# Patient Record
Sex: Male | Born: 2000 | Race: White | Hispanic: No | Marital: Single | Smoking: Former smoker
Health system: Southern US, Community
[De-identification: ages and names within clinical notes are randomized; demographics above are authoritative.]

## PROBLEM LIST (undated history)

## (undated) DIAGNOSIS — I10 Essential (primary) hypertension: Secondary | ICD-10-CM

## (undated) DIAGNOSIS — F32A Depression, unspecified: Secondary | ICD-10-CM

## (undated) DIAGNOSIS — E669 Obesity, unspecified: Secondary | ICD-10-CM

## (undated) DIAGNOSIS — M1990 Unspecified osteoarthritis, unspecified site: Secondary | ICD-10-CM

## (undated) DIAGNOSIS — K219 Gastro-esophageal reflux disease without esophagitis: Secondary | ICD-10-CM

## (undated) DIAGNOSIS — F419 Anxiety disorder, unspecified: Secondary | ICD-10-CM

## (undated) DIAGNOSIS — J45909 Unspecified asthma, uncomplicated: Secondary | ICD-10-CM

## (undated) DIAGNOSIS — N400 Enlarged prostate without lower urinary tract symptoms: Secondary | ICD-10-CM

## (undated) DIAGNOSIS — K200 Eosinophilic esophagitis: Secondary | ICD-10-CM

## (undated) DIAGNOSIS — J302 Other seasonal allergic rhinitis: Secondary | ICD-10-CM

## (undated) HISTORY — DX: Obesity, unspecified: E66.9

## (undated) HISTORY — DX: Benign prostatic hyperplasia without lower urinary tract symptoms: N40.0

## (undated) HISTORY — PX: OTHER SURGICAL HISTORY: SHX169

## (undated) HISTORY — DX: Depression, unspecified: F32.A

## (undated) HISTORY — DX: Unspecified asthma, uncomplicated: J45.909

## (undated) HISTORY — DX: Gastro-esophageal reflux disease without esophagitis: K21.9

## (undated) HISTORY — PX: WRIST ARTHROSCOPY: SUR100

## (undated) HISTORY — PX: VENTRAL HERNIA REPAIR: SHX424

## (undated) HISTORY — DX: Essential (primary) hypertension: I10

## (undated) HISTORY — DX: Eosinophilic esophagitis: K20.0

## (undated) HISTORY — PX: TONSILLECTOMY: SUR1361

## (undated) HISTORY — DX: Anxiety disorder, unspecified: F41.9

## (undated) HISTORY — PX: KNEE ARTHROSCOPY: SHX127

## (undated) HISTORY — DX: Other seasonal allergic rhinitis: J30.2

## (undated) HISTORY — PX: HAND SURGERY: SHX662

## (undated) HISTORY — DX: Unspecified osteoarthritis, unspecified site: M19.90

## (undated) HISTORY — PX: CIRCUMCISION: SUR203

## (undated) HISTORY — PX: ANKLE SURGERY: SHX546

---
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
## 2018-02-06 DIAGNOSIS — K439 Ventral hernia without obstruction or gangrene: Secondary | ICD-10-CM | POA: Insufficient documentation

## 2018-02-20 DIAGNOSIS — Z09 Encounter for follow-up examination after completed treatment for conditions other than malignant neoplasm: Secondary | ICD-10-CM | POA: Insufficient documentation

## 2018-06-20 DIAGNOSIS — R1033 Periumbilical pain: Secondary | ICD-10-CM | POA: Insufficient documentation

## 2019-07-10 DIAGNOSIS — S63591A Other specified sprain of right wrist, initial encounter: Secondary | ICD-10-CM | POA: Insufficient documentation

## 2019-07-10 DIAGNOSIS — S52511K Displaced fracture of right radial styloid process, subsequent encounter for closed fracture with nonunion: Secondary | ICD-10-CM | POA: Insufficient documentation

## 2019-09-10 DIAGNOSIS — M545 Low back pain, unspecified: Secondary | ICD-10-CM | POA: Insufficient documentation

## 2019-09-19 DIAGNOSIS — M25531 Pain in right wrist: Secondary | ICD-10-CM | POA: Insufficient documentation

## 2019-11-11 ENCOUNTER — Encounter: Payer: Self-pay | Admitting: Gastroenterology

## 2019-12-11 ENCOUNTER — Ambulatory Visit: Payer: Self-pay | Admitting: Gastroenterology

## 2020-01-01 ENCOUNTER — Encounter: Payer: Self-pay | Admitting: Gastroenterology

## 2020-01-22 ENCOUNTER — Ambulatory Visit: Payer: Self-pay | Admitting: Sports Medicine

## 2020-01-22 ENCOUNTER — Other Ambulatory Visit: Payer: Self-pay | Admitting: Sports Medicine

## 2020-01-22 DIAGNOSIS — M25571 Pain in right ankle and joints of right foot: Secondary | ICD-10-CM

## 2020-01-29 ENCOUNTER — Ambulatory Visit: Payer: Medicaid Other | Admitting: Gastroenterology

## 2020-02-27 ENCOUNTER — Ambulatory Visit: Payer: Medicaid Other | Admitting: Gastroenterology

## 2020-02-27 ENCOUNTER — Encounter: Payer: Self-pay | Admitting: Gastroenterology

## 2020-02-27 VITALS — BP 124/80 | HR 84 | Ht 75.0 in | Wt 231.0 lb

## 2020-02-27 DIAGNOSIS — R634 Abnormal weight loss: Secondary | ICD-10-CM | POA: Diagnosis not present

## 2020-02-27 DIAGNOSIS — K219 Gastro-esophageal reflux disease without esophagitis: Secondary | ICD-10-CM | POA: Diagnosis not present

## 2020-02-27 DIAGNOSIS — R109 Unspecified abdominal pain: Secondary | ICD-10-CM | POA: Diagnosis not present

## 2020-02-27 MED ORDER — DICYCLOMINE HCL 20 MG PO TABS: 20.0000 mg | ORAL_TABLET | Freq: Three times a day (TID) | ORAL | 1 refills | Status: DC

## 2020-02-27 MED ORDER — OMEPRAZOLE 40 MG PO CPDR: 40.0000 mg | DELAYED_RELEASE_CAPSULE | Freq: Two times a day (BID) | ORAL | 3 refills | Status: DC

## 2020-02-27 MED ORDER — SUTAB 1479-225-188 MG PO TABS: ORAL_TABLET | ORAL | 0 refills | Status: DC

## 2020-02-27 NOTE — Progress Notes (Signed)
Referring Provider: Ventura Sellers, MD Primary Care Physician:  Ventura Sellers, MD  Reason for Consultation:  Abdominal pain   IMPRESSION:  Acute episodes of abdominal pain with associated change in bowel habits.diarrhea Episode of blood on the toilet paper Reflux exacerbated despite medical therapy Unintentional weight loss of 50 pounds  Abdominal pain: Possible PUD. Differential is broad with associated change in bowel habits, weight loss, and blood on the toilet paper. EGD and colonoscopy recommended. Will obtain a copy of his CT scan from [facility].    PLAN: - Continue omeprazole 40 mg BID and famotidine 20 mg daily - Trial of dicyclomine 20 mg QID prn abdominal pain - EGD with esophageal, gastric, and duodenal biopsies - Colonoscopy with evaluation of the TI including biopsies - Obtain a copy of the CT scan from [facility]  Please see the "Patient Instructions" section for addition details about the plan.  HPI: Bob Roy is a 19 y.o. male referred by Dr. Delena Bali for further evaluation of abdominal pain and gastritis. The history is obtained through the patient and review of his referral records.  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 Majoring in missions with the goal of going overseas. Hoping to go to [place].  He completed the Covid vaccine.   History of obesity, lifetime history of GERD, asthma, hypertenion, depression, umbilical hernia repair, and asthma with worsening symptoms after a difficult relationship break-up in March. Unintentionally lost 50 pounds and found his symptoms worsened during that time  Now with episodes of acid reflux, diarrhea, and severe abdominal pain. Having 2-3 episodes every week Day starts with "acidic eructations" followed by vomiting "my guts up" and diarrhea.  Moving to only water without eating will improve his symptoms Can last for 3-4 days at a time Abdominal pain is an  epigastric, burning pain Ibuprofen 1-2 times daily controls his pain Rare blood on the toilet paper. No mucous in the stool.   He reports that a CT scan obtained during the evaluation for chest pain showed peptic ulcer ([place]) Lipase and liver enzymes were normal Referral records include abdominal x-ray 11/10/19 that was essentially normal Omeprazole increased to 40 mg daily and added famotidine.  No significant change in his symptoms despite medication changes.  Similar symptoms evaluated in 2008 included a normal abdominal ultrasound. UGI series showed marked GE reflux.   Mother with breast cancer with liver mets. Maternal grandmother with IBD. No known family history of colon cancer or polyps. No family history of uterine/endometrial cancer, pancreatic cancer or gastric/stomach cancer.   Past Medical History:  Diagnosis Date  . Asthma   . GERD (gastroesophageal reflux disease)   . HTN (hypertension)   . Obesity   . Seasonal allergies     Past Surgical History:  Procedure Laterality Date  . CIRCUMCISION    . HAND SURGERY     Ulna   . hernia    . KNEE ARTHROSCOPY    . WRIST ARTHROSCOPY      Current Outpatient Medications  Medication Sig Dispense Refill  . albuterol (VENTOLIN HFA) 108 (90 BASE) MCG/ACT inhaler Inhale 2 puffs into the lungs every 4 (four) hours as needed for wheezing or shortness of breath.    . cetirizine (ZYRTEC) 10 MG tablet TAKE 1 TABLET EVERY DAY AS NEEDED FOR RUNNY NOSE OR ITCHING  5  . cyproheptadine (PERIACTIN) 4 MG tablet Take 4 mg by mouth daily.    . fluticasone (FLONASE) 50  MCG/ACT nasal spray Place 2 sprays into both nostrils 2 (two) times daily.   3  . ibuprofen (ADVIL,MOTRIN) 400 MG tablet TAKE 1 TABLET EVERY 6 HOURS WITH FOOD  1  . Magnesium Oxide 500 MG TABS Take by mouth.    . metFORMIN (GLUCOPHAGE-XR) 500 MG 24 hr tablet Take 500 mg by mouth daily.  2  . metoprolol succinate (TOPROL-XL) 100 MG 24 hr tablet Take 100 mg by mouth daily.  2   . montelukast (SINGULAIR) 5 MG chewable tablet CHEW AND SWALLOW 1 TABLET DAILY  2  . omeprazole (PRILOSEC) 20 MG capsule TAKE 1 CAPSULE ONCE A DAY FOR REFLUX  5  . QVAR 40 MCG/ACT inhaler INHALE 2 PUFFS BY MOUTH EVERY DAY TO PREVENT COUGH/WHEEZE (RINSE MOUTH AFTER USE)  5  . riboflavin (VITAMIN B-2) 100 MG TABS tablet Take 100 mg by mouth daily.    Marland Kitchen topiramate (TOPAMAX) 25 MG tablet Take 2 tablets (50 mg total) by mouth 2 (two) times daily. (Start with 25 mg twice a day for the first week) 120 tablet 3   No current facility-administered medications for this visit.    Allergies as of 02/27/2020 - Review Complete 01/14/2015  Allergen Reaction Noted  . Amoxicillin Rash 05/09/2013  . Augmentin [amoxicillin-pot clavulanate] Rash 01/14/2015  . Sulfa antibiotics Rash 01/14/2015  . Sulfur Rash 05/09/2013    Family History  Problem Relation Age of Onset  . Migraines Mother        Resolved in adulthood  . Depression Mother   . Anxiety disorder Mother   . Cancer Father   . Cancer Paternal Grandmother   . Epilepsy Cousin     Social History   Socioeconomic History  . Marital status: Single    Spouse name: Not on file  . Number of children: Not on file  . Years of education: Not on file  . Highest education level: Not on file  Occupational History  . Not on file  Tobacco Use  . Smoking status: Never Smoker  . Smokeless tobacco: Never Used  Substance and Sexual Activity  . Alcohol use: No  . Drug use: No  . Sexual activity: Never    Birth control/protection: Abstinence  Other Topics Concern  . Not on file  Social History Narrative  . Not on file   Social Determinants of Health   Financial Resource Strain:   . Difficulty of Paying Living Expenses: Not on file  Food Insecurity:   . Worried About Charity fundraiser in the Last Year: Not on file  . Ran Out of Food in the Last Year: Not on file  Transportation Needs:   . Lack of Transportation (Medical): Not on file  .  Lack of Transportation (Non-Medical): Not on file  Physical Activity:   . Days of Exercise per Week: Not on file  . Minutes of Exercise per Session: Not on file  Stress:   . Feeling of Stress : Not on file  Social Connections:   . Frequency of Communication with Friends and Family: Not on file  . Frequency of Social Gatherings with Friends and Family: Not on file  . Attends Religious Services: Not on file  . Active Member of Clubs or Organizations: Not on file  . Attends Archivist Meetings: Not on file  . Marital Status: Not on file  Intimate Partner Violence:   . Fear of Current or Ex-Partner: Not on file  . Emotionally Abused: Not on file  .  Physically Abused: Not on file  . Sexually Abused: Not on file    Review of Systems: 12 system ROS is negative except as noted above with anxiety, arthritis, back pain, confusion, depression, and fatigue.   Physical Exam: General:   Alert,  well-nourished, pleasant and cooperative in NAD Head:  Normocephalic and atraumatic. Eyes:  Sclera clear, no icterus.   Conjunctiva pink. Ears:  Normal auditory acuity. Nose:  No deformity, discharge,  or lesions. Mouth:  No deformity or lesions.   Neck:  Supple; no masses or thyromegaly. Lungs:  Clear throughout to auscultation.   No wheezes. Heart:  Regular rate and rhythm; no murmurs. Abdomen:  Soft,nontender, nondistended, normal bowel sounds, no rebound or guarding. No hepatosplenomegaly.   Rectal:  Deferred  Msk:  Symmetrical. No boney deformities LAD: No inguinal or umbilical LAD Extremities:  No clubbing or edema. Neurologic:  Alert and  oriented x4;  grossly nonfocal Skin:  Intact without significant lesions or rashes. Psych:  Alert and cooperative. Normal mood and affect.     Salomon Ganser L. Tarri Glenn, MD, MPH 02/27/2020, 2:15 PM

## 2020-02-27 NOTE — Patient Instructions (Addendum)
If you are age 21 or older, your body mass index should be between 23-30. Your Body mass index is 28.87 kg/m. If this is out of the aforementioned range listed, please consider follow up with your Primary Care Provider.  If you are age 74 or younger, your body mass index should be between 19-25. Your Body mass index is 28.87 kg/m. If this is out of the aformentioned range listed, please consider follow up with your Primary Care Provider.   You have been scheduled for an endoscopy and colonoscopy. Please follow the written instructions given to you at your visit today. Please pick up your prep supplies at the pharmacy within the next 1-3 days. If you use inhalers (even only as needed), please bring them with you on the day of your procedure.  Continue omeprazole 40 mg twice a day and continue famotidine 20 mg once a day. Start Dicyclomine 20 mg 4 times a day with meals and bedtime as needed for abdominal pain.  Thank you for trusting me with your gastrointestinal care!    Thornton Park, MD, MPH

## 2020-03-01 ENCOUNTER — Other Ambulatory Visit: Payer: Self-pay

## 2020-03-01 DIAGNOSIS — K219 Gastro-esophageal reflux disease without esophagitis: Secondary | ICD-10-CM

## 2020-03-01 DIAGNOSIS — R109 Unspecified abdominal pain: Secondary | ICD-10-CM

## 2020-03-01 DIAGNOSIS — R634 Abnormal weight loss: Secondary | ICD-10-CM

## 2020-03-10 ENCOUNTER — Other Ambulatory Visit: Payer: Self-pay | Admitting: Sports Medicine

## 2020-03-10 ENCOUNTER — Ambulatory Visit: Payer: Medicaid Other | Admitting: Sports Medicine

## 2020-03-10 ENCOUNTER — Ambulatory Visit (INDEPENDENT_AMBULATORY_CARE_PROVIDER_SITE_OTHER): Payer: Medicaid Other

## 2020-03-10 ENCOUNTER — Other Ambulatory Visit: Payer: Self-pay

## 2020-03-10 ENCOUNTER — Encounter: Payer: Self-pay | Admitting: Sports Medicine

## 2020-03-10 DIAGNOSIS — M779 Enthesopathy, unspecified: Secondary | ICD-10-CM

## 2020-03-10 DIAGNOSIS — G8929 Other chronic pain: Secondary | ICD-10-CM | POA: Diagnosis not present

## 2020-03-10 DIAGNOSIS — M7751 Other enthesopathy of right foot: Secondary | ICD-10-CM | POA: Diagnosis not present

## 2020-03-10 DIAGNOSIS — M25371 Other instability, right ankle: Secondary | ICD-10-CM

## 2020-03-10 DIAGNOSIS — M25571 Pain in right ankle and joints of right foot: Secondary | ICD-10-CM

## 2020-03-10 DIAGNOSIS — M79671 Pain in right foot: Secondary | ICD-10-CM

## 2020-03-10 MED ORDER — PREDNISONE 10 MG (21) PO TBPK
ORAL_TABLET | ORAL | 0 refills | Status: DC
Start: 2020-03-10 — End: 2020-04-08

## 2020-03-10 MED ORDER — MELOXICAM 15 MG PO TABS: 15.0000 mg | ORAL_TABLET | Freq: Every day | ORAL | 0 refills | Status: DC

## 2020-03-10 NOTE — Progress Notes (Signed)
Subjective:  Bob Roy is a 19 y.o. male patient who presents to office for evaluation of right ankle pain. Patient complains of continued pain in the ankle for many years slowly getting worse with some clicking when walking and standing reports that when he was a kid in the fourth grade had injuries to the ankle and fractured his growth plate. Patient has tried icing heat and topical compression/wrap with no relief in symptoms. Patient denies any other pedal complaints. Denies recent injury/trip/fall/sprain/any other causative factors.  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 07/10/2019   Traumatic tear of triangular fibrocartilage complex of right wrist 16/04/9603   Umbilical pain 54/03/8118   Postoperative examination 02/20/2018   Ventral hernia without obstruction or gangrene 02/06/2018   Myofascial pain 04/25/2016   Asthma 03/23/2015   AR (allergic rhinitis) 03/23/2015   LPRD (laryngopharyngeal reflux disease) 03/23/2015   Other secondary hypertension 01/14/2015   Postconcussion syndrome 01/14/2015   Migraine without aura and without status migrainosus, not intractable 01/14/2015   Chronic daily headache 01/14/2015   Medication overuse headache 01/14/2015   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     dicyclomine (BENTYL) 20 MG tablet Take 1 tablet (20 mg total) by mouth 4 (four) times daily -  before meals and at bedtime. 120  tablet 1   famotidine (PEPCID) 40 MG tablet Take 40 mg by mouth at bedtime.     ibuprofen (ADVIL,MOTRIN) 400 MG tablet TAKE 1 TABLET EVERY 6 HOURS WITH FOOD  1   omeprazole (PRILOSEC) 40 MG capsule Take 40 mg by mouth every morning.     Sodium Sulfate-Mag Sulfate-KCl (SUTAB) [phone] MG TABS Use kit as directed MANUFACTURER CODES!! BIN: [id number] PCN: CN GROUP: [id number] MEMBER ID: [id number];VOZ AS CASH;NO PRIOR AUTHORIZATION 12 tablet 0   No current facility-administered medications on file prior to visit.    Allergies  Allergen Reactions   Amoxicillin Rash   Augmentin [Amoxicillin-Pot Clavulanate] Rash   Sulfa Antibiotics Rash   Sulfur Rash    Objective:  General: Alert and oriented x3 in no acute distress  Dermatology: No open lesions bilateral lower extremities, no webspace macerations, no ecchymosis bilateral, all nails x 10 are well manicured.  Vascular: Dorsalis Pedis and Posterior Tibial pedal pulses palpable, Capillary Fill Time 3 seconds,(+) pedal hair growth bilateral, no edema bilateral lower extremities, Temperature gradient within normal limits.  Neurology: Johney Maine sensation intact via light touch bilateral.  Musculoskeletal: Mild tenderness with palpation at lateral ankle along the peroneal tendon there is also pain of noted to the medial ankle at the medial ankle gutter on the right, negative talar tilt, Negative tib-fib stress, subjective instability and clicking right ankle but no audible clicking on range of motion at today's exam. No pain with calf compression bilateral. Range of motion within normal limits with mild guarding on right ankle. Strength within normal limits in all groups bilateral.   Gait: Antalgic gait  Xrays  Right ankle  Impression: No acute osseous finding  Assessment and Plan: Problem List Items Addressed This Visit    None    Visit Diagnoses    Chronic pain of right ankle    -  Primary   Relevant Medications   meloxicam  (MOBIC) 15 MG tablet   predniSONE (STERAPRED UNI-PAK 21 TAB) 10 MG (21) TBPK tablet   Ankle instability, right       Tendinitis           -Complete examination performed -Xrays reviewed -Discussed treatement options for history of chronic pain and ankle with subjective instability and possible superimposed tendinitis -Advised patient to continue with ankle support that he already has -Prescribed meloxicam for patient to take as instructed as well as steroid Dosepak -Dispense Surgigrip pression sleeve for patient to wear for edema control additional support to ankle -Advised patient if symptoms continue may benefit from an MRI since have been going on for years -Patient to return to office as scheduled or sooner if condition worsens.  Landis Martins, DPM

## 2020-03-19 ENCOUNTER — Other Ambulatory Visit: Payer: Self-pay | Admitting: Sports Medicine

## 2020-03-19 DIAGNOSIS — M779 Enthesopathy, unspecified: Secondary | ICD-10-CM

## 2020-03-22 ENCOUNTER — Other Ambulatory Visit: Payer: Self-pay | Admitting: Gastroenterology

## 2020-04-05 ENCOUNTER — Other Ambulatory Visit: Payer: Self-pay | Admitting: Sports Medicine

## 2020-04-06 ENCOUNTER — Other Ambulatory Visit: Payer: Self-pay | Admitting: Gastroenterology

## 2020-04-07 ENCOUNTER — Other Ambulatory Visit: Payer: Self-pay

## 2020-04-07 ENCOUNTER — Ambulatory Visit: Payer: Medicaid Other | Admitting: Sports Medicine

## 2020-04-07 ENCOUNTER — Ambulatory Visit (INDEPENDENT_AMBULATORY_CARE_PROVIDER_SITE_OTHER): Payer: Medicaid Other | Admitting: Sports Medicine

## 2020-04-07 ENCOUNTER — Telehealth: Payer: Self-pay

## 2020-04-07 ENCOUNTER — Encounter: Payer: Self-pay | Admitting: Sports Medicine

## 2020-04-07 DIAGNOSIS — M7751 Other enthesopathy of right foot: Secondary | ICD-10-CM

## 2020-04-07 DIAGNOSIS — M779 Enthesopathy, unspecified: Secondary | ICD-10-CM

## 2020-04-07 DIAGNOSIS — M25571 Pain in right ankle and joints of right foot: Secondary | ICD-10-CM

## 2020-04-07 DIAGNOSIS — G8929 Other chronic pain: Secondary | ICD-10-CM

## 2020-04-07 DIAGNOSIS — M25371 Other instability, right ankle: Secondary | ICD-10-CM

## 2020-04-07 MED ORDER — DICLOFENAC SODIUM 75 MG PO TBEC: 75.0000 mg | DELAYED_RELEASE_TABLET | Freq: Two times a day (BID) | ORAL | 0 refills | Status: DC

## 2020-04-07 NOTE — Progress Notes (Signed)
Subjective:  Bob Roy is a 19 y.o. male patient who returns to office for follow-up evaluation foot and ankle pain.  Patient reports that the pain feels the same he does notice a difference with the meloxicam helps for about 8 hours then afterwards the pain starts coming back reports that his ankle still feels weak and he still has pain with weightbearing with clicking that has been going on for years.  Noted.  Patient Active Problem List   Diagnosis Date Noted  . Right wrist pain 09/19/2019  . Lumbar back pain 09/10/2019  . 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 Traumatic tear of triangular fibrocartilage complex of right wrist 07/10/2019  . Umbilical pain 36/14/4315  . Postoperative examination 02/20/2018  . Ventral hernia without obstruction or gangrene 02/06/2018  . Myofascial pain 04/25/2016  . Asthma 03/23/2015  . AR (allergic rhinitis) 03/23/2015  . 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 Other secondary hypertension 01/14/2015  . Postconcussion syndrome 01/14/2015  . Migraine without aura and without status migrainosus, not intractable 01/14/2015  . Chronic daily headache 01/14/2015  . Medication overuse headache 01/14/2015  . 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 Left anterior knee pain 12/16/2014    Current Outpatient Medications on File Prior to Visit  Medication Sig Dispense Refill  . cetirizine (ZYRTEC) 10 MG tablet TAKE 1 TABLET EVERY DAY AS NEEDED FOR RUNNY NOSE OR ITCHING  5  . cyclobenzaprine (FLEXERIL) 5 MG tablet Take 5 mg by mouth 3 (three) times daily.    Marland Kitchen dicyclomine (BENTYL) 20 MG tablet TAKE 1 TABLET BY MOUTH 4 TIMES DAILY - BEFORE MEALS AND AT BEDTIME. 360 tablet 1  . famotidine (PEPCID) 40 MG tablet Take 40 mg by mouth at bedtime.    Marland Kitchen ibuprofen (ADVIL,MOTRIN) 400 MG tablet TAKE 1 TABLET EVERY 6 HOURS WITH FOOD  1  . meloxicam (MOBIC) 15 MG  tablet Take 1 tablet (15 mg total) by mouth daily. 30 tablet 0  . omeprazole (PRILOSEC) 40 MG capsule Take 40 mg by mouth every morning.    . predniSONE (STERAPRED UNI-PAK 21 TAB) 10 MG (21) TBPK tablet Take as directed 21 tablet 0  . Sodium Sulfate-Mag Sulfate-KCl (SUTAB) [phone] MG TABS Use kit as directed MANUFACTURER CODES!! BIN: [id number] PCN: CN GROUP: [id number] MEMBER ID: [id number];BHA AS CASH;NO PRIOR AUTHORIZATION 12 tablet 0   No current facility-administered medications on file prior to visit.    Allergies  Allergen Reactions  . Amoxicillin Rash  . Augmentin [Amoxicillin-Pot Clavulanate] Rash  . Sulfa Antibiotics Rash  . Sulfur Rash    Objective:  General: Alert and oriented x3 in no acute distress  Dermatology: No open lesions bilateral lower extremities, no webspace macerations, no ecchymosis bilateral, all nails x 10 are well manicured.  Vascular: Dorsalis Pedis and Posterior Tibial pedal pulses palpable, Capillary Fill Time 3 seconds,(+) pedal hair growth bilateral, no edema bilateral lower extremities, Temperature gradient within normal limits.  Neurology: Johney Maine sensation intact via light touch bilateral.  Musculoskeletal: Mild tenderness with palpation at lateral ankle along the peroneal tendon there is also pain of noted to the medial ankle at the medial ankle gutter on the right, negative talar tilt, Negative tib-fib stress, subjective instability and clicking right ankle but no audible clicking on range of motion at today's exam like previous. No pain with calf compression bilateral. Range of motion within normal limits with mild guarding on right ankle. Strength  within normal limits in all groups bilateral.    Assessment and Plan: Problem List Items Addressed This Visit    None    Visit Diagnoses    Chronic pain of right ankle    -  Primary   Relevant Orders   MR ANKLE RIGHT WO CONTRAST   Ankle instability, right       Tendinitis           -Complete  examination performed -Re-Discussed treatement options for history of chronic pain and ankle with subjective instability and possible superimposed tendinitis -Changed meloxicam to diclofenac to see if this will give patient additional relief meanwhile recommend further evaluation of foot and ankle with MRI -Ordered MRI to rule out any type of ligament involvement due to instability and chronic pain and symptoms in ankle with negative x-rays -Advised patient to continue with ankle support that he already has at home meanwhile -Advised patient continue with good supportive shoes and for foot type -Patient to return to office after MRI or sooner if condition worsens.  Landis Martins, DPM

## 2020-04-07 NOTE — Telephone Encounter (Signed)
Moved procedure time up to 1:30 tomorrow.

## 2020-04-08 ENCOUNTER — Ambulatory Visit: Payer: Medicaid Other | Admitting: Gastroenterology

## 2020-04-08 ENCOUNTER — Encounter: Payer: Self-pay | Admitting: Gastroenterology

## 2020-04-08 VITALS — BP 107/57 | HR 79 | Temp 98.7°F | Resp 12 | Ht 75.0 in | Wt 231.0 lb

## 2020-04-08 DIAGNOSIS — D122 Benign neoplasm of ascending colon: Secondary | ICD-10-CM

## 2020-04-08 DIAGNOSIS — K219 Gastro-esophageal reflux disease without esophagitis: Secondary | ICD-10-CM

## 2020-04-08 DIAGNOSIS — R109 Unspecified abdominal pain: Secondary | ICD-10-CM

## 2020-04-08 MED ORDER — SODIUM CHLORIDE 0.9 % IV SOLN: 500.0000 mL | INTRAVENOUS | Status: DC

## 2020-04-08 NOTE — Progress Notes (Signed)
Called to room to assist during endoscopic procedure.  Patient ID and intended procedure confirmed with present staff. Received instructions for my participation in the procedure from the performing physician.  

## 2020-04-08 NOTE — Op Note (Signed)
[facility] Patient Name: Bob Roy Procedure Date: 04/08/2020 1:32 PM MRN: [id number] Endoscopist: Thornton Park MD, MD Age: 19 Referring MD:  Date of Birth: Jul 16, 2000 Gender: Male Account #: [id number] Procedure:                Upper GI endoscopy Indications:              Abdominal pain, Diarrhea, Weight loss                           Acute episodes of abdominal pain with associated                            change in bowel habits, diarrhea                           Reflux despite medical therapy                           Unintentional weight loss of 50 pounds Medicines:                Monitored Anesthesia Care Procedure:                Pre-Anesthesia Assessment:                           - Prior to the procedure, a History and Physical                            was performed, and patient medications and                            allergies were reviewed. The patient's tolerance of                            previous anesthesia was also reviewed. The risks                            and benefits of the procedure and the sedation                            options and risks were discussed with the patient.                            All questions were answered, and informed consent                            was obtained. Prior Anticoagulants: The patient has                            taken no previous anticoagulant or antiplatelet                            agents. ASA Grade Assessment: II - A patient with  mild systemic disease. After reviewing the risks                            and benefits, the patient was deemed in                            satisfactory condition to undergo the procedure.                           After obtaining informed consent, the endoscope was                            passed under direct vision. Throughout the                            procedure, the patient's blood pressure, pulse, and                             oxygen saturations were monitored continuously. The                            Endoscope was introduced through the mouth, and                            advanced to the third part of duodenum. The upper                            GI endoscopy was accomplished without difficulty.                            The patient tolerated the procedure well. Scope In: Scope Out: Findings:                 The examined esophagus was normal. Biopsies were                            taken from the mid/proximal and distal esophagus                            with a cold forceps for histology. Estimated blood                            loss was minimal.                           Diffuse mildly erythematous mucosa without bleeding                            was found in the gastric body and in the gastric                            antrum. Biopsies were taken from the antrum, body,  and fundus with a cold forceps for histology.                            Estimated blood loss was minimal.                           The examined duodenum was normal. Biopsies were                            taken with a cold forceps for histology. Estimated                            blood loss was minimal.                           The cardia and gastric fundus were normal on                            retroflexion. Complications:            No immediate complications. Estimated blood loss:                            Minimal. Estimated Blood Loss:     Estimated blood loss was minimal. Impression:               - Normal esophagus. Biopsied.                           - Erythematous mucosa in the gastric body and                            antrum. Biopsied.                           - Normal examined duodenum. Biopsied.                           - No obvious source for symptoms identified.                            Awaiting biopsy results. Recommendation:           - Patient has a  contact number available for                            emergencies. The signs and symptoms of potential                            delayed complications were discussed with the                            patient. Return to normal activities tomorrow.                            Written discharge instructions were provided to the  patient.                           - Resume previous diet.                           - Continue present medications.                           - No aspirin, ibuprofen, naproxen, or other                            non-steroidal anti-inflammatory drugs.                           - Await pathology results.                           - Proceed with colonoscopy today as previously                            planned. Thornton Park MD, MD 04/08/2020 2:17:27 PM This report has been signed electronically.

## 2020-04-08 NOTE — Progress Notes (Signed)
A/ox3, pleased with MAC, report to RN 

## 2020-04-08 NOTE — Patient Instructions (Signed)
YOU HAD AN ENDOSCOPIC PROCEDURE TODAY AT THE [facility]:   Refer to the procedure report that was given to you for any specific questions about what was found during the examination.  If the procedure report does not answer your questions, please call your gastroenterologist to clarify.  If you requested that your care partner not be given the details of your procedure findings, then the procedure report has been included in a sealed envelope for you to review at your convenience later.  YOU SHOULD EXPECT: Some feelings of bloating in the abdomen. Passage of more gas than usual.  Walking can help get rid of the air that was put into your GI tract during the procedure and reduce the bloating. If you had a lower endoscopy (such as a colonoscopy or flexible sigmoidoscopy) you may notice spotting of blood in your stool or on the toilet paper. If you underwent a bowel prep for your procedure, you may not have a normal bowel movement for a few days.  Please Note:  You might notice some irritation and congestion in your nose or some drainage.  This is from the oxygen used during your procedure.  There is no need for concern and it should clear up in a day or so.  SYMPTOMS TO REPORT IMMEDIATELY:   Following lower endoscopy (colonoscopy or flexible sigmoidoscopy):  Excessive amounts of blood in the stool  Significant tenderness or worsening of abdominal pains  Swelling of the abdomen that is new, acute  Fever of 100F or higher   Following upper endoscopy (EGD)  Vomiting of blood or coffee ground material  New chest pain or pain under the shoulder blades  Painful or persistently difficult swallowing  New shortness of breath  Fever of 100F or higher  Black, tarry-looking stools  For urgent or emergent issues, a gastroenterologist can be reached at any hour by calling [phone]. Do not use MyChart messaging for urgent concerns.    DIET:  We do recommend a small meal at first, but  then you may proceed to your regular diet.  Drink plenty of fluids but you should avoid alcoholic beverages for 24 hours.  ACTIVITY:  You should plan to take it easy for the rest of today and you should NOT DRIVE or use heavy machinery until tomorrow (because of the sedation medicines used during the test).    FOLLOW UP: Our staff will call the number listed on your records 48-72 hours following your procedure to check on you and address any questions or concerns that you may have regarding the information given to you following your procedure. If we do not reach you, we will leave a message.  We will attempt to reach you two times.  During this call, we will ask if you have developed any symptoms of COVID 19. If you develop any symptoms (ie: fever, flu-like symptoms, shortness of breath, cough etc.) before then, please call [phone].  If you test positive for Covid 19 in the 2 weeks post procedure, please call and report this information to us.    If any biopsies were taken you will be contacted by phone or by letter within the next 1-3 weeks.  Please call us at [phone] if you have not heard about the biopsies in 3 weeks.    SIGNATURES/CONFIDENTIALITY: You and/or your care partner have signed paperwork which will be entered into your electronic medical record.  These signatures attest to the fact that that the information above on   your After Visit Summary has been reviewed and is understood.  Full responsibility of the confidentiality of this discharge information lies with you and/or your care-partner. 

## 2020-04-08 NOTE — Op Note (Signed)
[facility] Patient Name: Bob Roy Procedure Date: 04/08/2020 1:31 PM MRN: [id number] Endoscopist: Thornton Park MD, MD Age: 19 Referring MD:  Date of Birth: [birth year] Gender: Male Account #: [id number] Procedure:                Colonoscopy Indications:              Abdominal pain, Clinically significant diarrhea of                            unexplained origin, Rectal bleeding, Weight loss Medicines:                Monitored Anesthesia Care Procedure:                Pre-Anesthesia Assessment:                           - Prior to the procedure, a History and Physical                            was performed, and patient medications and                            allergies were reviewed. The patient's tolerance of                            previous anesthesia was also reviewed. The risks                            and benefits of the procedure and the sedation                            options and risks were discussed with the patient.                            All questions were answered, and informed consent                            was obtained. Prior Anticoagulants: The patient has                            taken no previous anticoagulant or antiplatelet                            agents. ASA Grade Assessment: II - A patient with                            mild systemic disease. After reviewing the risks                            and benefits, the patient was deemed in                            satisfactory condition to undergo the procedure.  After obtaining informed consent, the colonoscope                            was passed under direct vision. Throughout the                            procedure, the patient's blood pressure, pulse, and                            oxygen saturations were monitored continuously. The                            Colonoscope was introduced through the anus and                             advanced to the 10 cm into the ileum. The                            colonoscopy was performed without difficulty. The                            patient tolerated the procedure well. The quality                            of the bowel preparation was good. The terminal                            ileum, ileocecal valve, appendiceal orifice, and                            rectum were photographed. Scope In: 1:50:26 PM Scope Out: 2:09:26 PM Scope Withdrawal Time: 0 hours 16 minutes 44 seconds  Total Procedure Duration: 0 hours 19 minutes 0 seconds  Findings:                 The perianal and digital rectal examinations were                            normal.                           The colon (entire examined portion) appeared                            normal. Biopsies were taken throughout the colon                            with a cold forceps for histology.                           A 7 mm polyp was found in the ascending colon. The                            polyp was carpeted. The polyp was removed with a  piecemeal technique using a cold snare. Resection                            and retrieval were complete. Estimated blood loss                            was minimal.                           The ileum appeared normal. Biopsies were taken with                            a cold forceps for histology. Estimated blood loss                            was minimal.                           The exam was otherwise without abnormality on                            direct and retroflexion views. Complications:            No immediate complications. Estimated blood loss:                            Minimal. Estimated Blood Loss:     Estimated blood loss was minimal. Impression:               - The entire examined colon is normal. Biopsied.                           - One 7 mm polyp in the ascending colon, removed                            piecemeal using a cold  snare. Resected and                            retrieved.                           - The terminal ileum is normal. Biopsied.                           - The examination was otherwise normal on direct                            and retroflexion views. Recommendation:           - Patient has a contact number available for                            emergencies. The signs and symptoms of potential                            delayed complications were discussed with the  patient. Return to normal activities tomorrow.                            Written discharge instructions were provided to the                            patient.                           - Resume previous diet.                           - Continue present medications.                           - Await pathology results.                           - Repeat colonoscopy date to be determined after                            pending pathology results are reviewed for                            surveillance.                           - Emerging evidence supports eating a diet of                            fruits, vegetables, grains, calcium, and yogurt                            while reducing red meat and alcohol may reduce the                            risk of colon cancer. Thornton Park MD, MD 04/08/2020 2:22:07 PM This report has been signed electronically.

## 2020-04-09 DIAGNOSIS — K635 Polyp of colon: Secondary | ICD-10-CM | POA: Diagnosis not present

## 2020-04-09 DIAGNOSIS — K2289 Other specified disease of esophagus: Secondary | ICD-10-CM

## 2020-04-09 NOTE — Addendum Note (Signed)
Addended by: Ernestine Conrad D on: 04/09/2020 08:08 AM   Modules accepted: Orders

## 2020-04-12 ENCOUNTER — Telehealth: Payer: Self-pay

## 2020-04-12 ENCOUNTER — Telehealth: Payer: Self-pay | Admitting: *Deleted

## 2020-04-12 NOTE — Telephone Encounter (Signed)
  Follow up Call-  Call back number 04/08/2020  Post procedure Call Back phone  # [phone]  Permission to leave phone message Yes  Some recent data might be hidden     Patient questions:  Do you have a fever, pain , or abdominal swelling? No. Pain Score  0 *  Have you tolerated food without any problems? Yes.    Have you been able to return to your normal activities? Yes.    Do you have any questions about your discharge instructions: Diet   No. Medications  No. Follow up visit  No.  Do you have questions or concerns about your Care? No.  Actions: * If pain score is 4 or above: No action needed, pain <4.  1. Have you developed a fever since your procedure? no  2.   Have you had an respiratory symptoms (SOB or cough) since your procedure? no  3.   Have you tested positive for COVID 19 since your procedure no  4.   Have you had any family members/close contacts diagnosed with the COVID 19 since your procedure?  no   If yes to any of these questions please route to Joylene John, RN and Joella Prince, RN

## 2020-04-12 NOTE — Telephone Encounter (Signed)
  Follow up Call-  Call back number 04/08/2020  Post procedure Call Back phone  # [phone]  Permission to leave phone message Yes  Some recent data might be hidden    [facility]

## 2020-04-12 NOTE — Telephone Encounter (Signed)
Called and spoke with [name] from [facility] and the reference number is [id number] and I had to get the proper form off of the [employer] site and fill the form out and had to fax to [employer] at [phone] and done that today. Bob Roy

## 2020-04-14 ENCOUNTER — Telehealth: Payer: Self-pay | Admitting: *Deleted

## 2020-04-14 NOTE — Telephone Encounter (Signed)
[employer] sent over a fax with the reference number of [id number] and [id number] and the services applied were 04/12/2020 thru 05/02/2020. Bob Roy

## 2020-04-14 NOTE — Telephone Encounter (Signed)
Got a approval from [facility] for the MRI for the patient and the services are from 04/12/2020 to 05/02/2020 and called and spoke with [name] at [facility] and will be the week of October 26th, 2021. Bob Roy

## 2020-04-28 ENCOUNTER — Telehealth: Payer: Self-pay | Admitting: Gastroenterology

## 2020-04-28 NOTE — Telephone Encounter (Signed)
Encounter opened in error

## 2020-05-05 DIAGNOSIS — S60221A Contusion of right hand, initial encounter: Secondary | ICD-10-CM | POA: Insufficient documentation

## 2020-05-10 ENCOUNTER — Other Ambulatory Visit: Payer: Self-pay

## 2020-05-10 ENCOUNTER — Ambulatory Visit
Admission: RE | Admit: 2020-05-10 | Discharge: 2020-05-10 | Disposition: A | Discharge status: Home / Self Care | Payer: Medicaid Other | Source: Ambulatory Visit | Attending: Sports Medicine | Admitting: Sports Medicine

## 2020-05-10 DIAGNOSIS — G8929 Other chronic pain: Secondary | ICD-10-CM

## 2020-05-10 DIAGNOSIS — M25571 Pain in right ankle and joints of right foot: Secondary | ICD-10-CM

## 2020-05-21 ENCOUNTER — Ambulatory Visit: Payer: Medicaid Other | Admitting: Sports Medicine

## 2020-06-07 ENCOUNTER — Other Ambulatory Visit: Payer: Self-pay | Admitting: Sports Medicine

## 2020-06-07 NOTE — Telephone Encounter (Signed)
Please advise 

## 2020-06-08 ENCOUNTER — Other Ambulatory Visit: Payer: Self-pay | Admitting: Gastroenterology

## 2020-06-08 ENCOUNTER — Other Ambulatory Visit: Payer: Self-pay

## 2020-06-08 ENCOUNTER — Ambulatory Visit (INDEPENDENT_AMBULATORY_CARE_PROVIDER_SITE_OTHER): Payer: Medicaid Other | Admitting: Sports Medicine

## 2020-06-08 ENCOUNTER — Encounter: Payer: Self-pay | Admitting: Sports Medicine

## 2020-06-08 DIAGNOSIS — M25571 Pain in right ankle and joints of right foot: Secondary | ICD-10-CM

## 2020-06-08 DIAGNOSIS — M25371 Other instability, right ankle: Secondary | ICD-10-CM

## 2020-06-08 DIAGNOSIS — M19071 Primary osteoarthritis, right ankle and foot: Secondary | ICD-10-CM

## 2020-06-08 DIAGNOSIS — M2141 Flat foot [pes planus] (acquired), right foot: Secondary | ICD-10-CM

## 2020-06-08 DIAGNOSIS — M7751 Other enthesopathy of right foot: Secondary | ICD-10-CM | POA: Diagnosis not present

## 2020-06-08 DIAGNOSIS — M779 Enthesopathy, unspecified: Secondary | ICD-10-CM | POA: Diagnosis not present

## 2020-06-08 DIAGNOSIS — M2142 Flat foot [pes planus] (acquired), left foot: Secondary | ICD-10-CM

## 2020-06-08 DIAGNOSIS — Q688 Other specified congenital musculoskeletal deformities: Secondary | ICD-10-CM

## 2020-06-08 DIAGNOSIS — G8929 Other chronic pain: Secondary | ICD-10-CM

## 2020-06-08 MED ORDER — DEXAMETHASONE SODIUM PHOSPHATE 120 MG/30ML IJ SOLN
4.0000 mg | Freq: Once | INTRAMUSCULAR | Status: AC
  Administered 2020-06-08: 4 mg via INTRA_ARTICULAR

## 2020-06-08 MED ORDER — DICLOFENAC SODIUM 75 MG PO TBEC
75.0000 mg | DELAYED_RELEASE_TABLET | Freq: Two times a day (BID) | ORAL | 0 refills | Status: DC
Start: 2020-06-08 — End: 2020-09-27

## 2020-06-08 NOTE — Progress Notes (Addendum)
Subjective:  Bob Roy is a 19 y.o. male patient who returns to office for follow-up evaluation foot and ankle pain and for MRI results. Reports that pain is same. Did not get Diclofenac and has been taking motrin instead. Ankle and foot still feels weak on right and states that now he is getting a similar pain on the left as well. No other issues noted.  Patient Active Problem List   Diagnosis Date Noted  . Right wrist pain 09/19/2019  . Lumbar back pain 09/10/2019  . 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 Traumatic tear of triangular fibrocartilage complex of right wrist 07/10/2019  . Umbilical pain 21/19/4174  . Postoperative examination 02/20/2018  . Ventral hernia without obstruction or gangrene 02/06/2018  . Myofascial pain 04/25/2016  . Asthma 03/23/2015  . AR (allergic rhinitis) 03/23/2015  . 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 Other secondary hypertension 01/14/2015  . Postconcussion syndrome 01/14/2015  . Migraine without aura and without status migrainosus, not intractable 01/14/2015  . Chronic daily headache 01/14/2015  . Medication overuse headache 01/14/2015  . 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 Left anterior knee pain 12/16/2014    Current Outpatient Medications on File Prior to Visit  Medication Sig Dispense Refill  . cetirizine (ZYRTEC) 10 MG tablet TAKE 1 TABLET EVERY DAY AS NEEDED FOR RUNNY NOSE OR ITCHING  5  . cyclobenzaprine (FLEXERIL) 5 MG tablet Take 5 mg by mouth 3 (three) times daily.    Marland Kitchen dicyclomine (BENTYL) 20 MG tablet TAKE 1 TABLET BY MOUTH 4 TIMES DAILY - BEFORE MEALS AND AT BEDTIME. 360 tablet 1  . famotidine (PEPCID) 40 MG tablet Take 40 mg by mouth at bedtime.    . meloxicam (MOBIC) 15 MG tablet TAKE 1 TABLET BY MOUTH EVERY DAY 30 tablet 0  . omeprazole (PRILOSEC) 40 MG capsule Take 40 mg by mouth every morning.     No  current facility-administered medications on file prior to visit.    Allergies  Allergen Reactions  . Amoxicillin Rash  . Augmentin [Amoxicillin-Pot Clavulanate] Rash  . Sulfa Antibiotics Rash  . Sulfur Rash    Objective:  General: Alert and oriented x3 in no acute distress  Dermatology: No open lesions bilateral lower extremities, no webspace macerations, no ecchymosis bilateral, all nails x 10 are well manicured.  Vascular: Dorsalis Pedis and Posterior Tibial pedal pulses palpable, Capillary Fill Time 3 seconds,(+) pedal hair growth bilateral, no edema bilateral lower extremities, Temperature gradient within normal limits.  Neurology: Johney Maine sensation intact via light touch bilateral.  Musculoskeletal: Mild tenderness with palpation at lateral ankle along the peroneal tendon there is also pain of noted to the medial ankle at the medial ankle gutter on the right, negative talar tilt, Negative tib-fib stress, subjective instability and clicking right ankle but no audible clicking on range of motion at today's exam like previous. No pain with calf compression bilateral. Range of motion within normal limits with mild guarding on right ankle. Strength within normal limits in all groups bilateral.    Assessment and Plan: Problem List Items Addressed This Visit    None    Visit Diagnoses    Capsulitis of ankle, right    -  Primary   Relevant Medications   diclofenac (VOLTAREN) 75 MG EC tablet   dexamethasone (DECADRON) injection 4 mg (Completed) (Start on 06/08/2020  7:30 PM)   Chronic pain of right ankle  Relevant Medications   diclofenac (VOLTAREN) 75 MG EC tablet   dexamethasone (DECADRON) injection 4 mg (Completed) (Start on 06/08/2020  7:30 PM)   Ankle instability, right       Tendinitis       Arthritis of right foot       Relevant Medications   diclofenac (VOLTAREN) 75 MG EC tablet   dexamethasone (DECADRON) injection 4 mg (Completed) (Start on 06/08/2020  7:30 PM)   Os  trigonum syndrome       Pes planus of both feet           -Complete examination performed -Re-Discussed treatement options for history of chronic pain and ankle with subjective instability and possible superimposed tendinitis -MRI confirms tendonitis at os trigonum -MRI also suggest midfoot arthritis likely due to pes planus -After oral consent and aseptic prep, injected a mixture containing 1 ml of 2%  plain lidocaine, 1 ml 0.5% plain marcaine, 1 ml of dexamethasone phosphate into Right ankle at sinus tarsi to help with pain without complication. Post-injection care discussed with patient.  -Gave patient used CAM boot to use for right foot and ankle pain -Rx Diclofenac to see if this will give relief since he did not get the previous Rx  -Advised patient to stop work out or activities that can make pain worse -Advised patient if the left foot does not get better we can check at next visit with Xray  -Patient to return to office in 1 month or sooner if condition worsens.  Landis Martins, DPM

## 2020-06-10 ENCOUNTER — Other Ambulatory Visit: Payer: Self-pay | Admitting: Sports Medicine

## 2020-06-10 MED ORDER — CELECOXIB 200 MG PO CAPS: 200.0000 mg | ORAL_CAPSULE | Freq: Two times a day (BID) | ORAL | 1 refills | Status: DC

## 2020-06-10 NOTE — Progress Notes (Signed)
Changed diclofenac to celebrex

## 2020-06-11 ENCOUNTER — Telehealth: Payer: Self-pay | Admitting: *Deleted

## 2020-06-11 NOTE — Telephone Encounter (Signed)
Called patient and relayed the message per Dr Stover. Kamil Mchaffie °

## 2020-06-15 ENCOUNTER — Encounter: Payer: Self-pay | Admitting: Gastroenterology

## 2020-06-15 ENCOUNTER — Other Ambulatory Visit (INDEPENDENT_AMBULATORY_CARE_PROVIDER_SITE_OTHER): Payer: Medicaid Other

## 2020-06-15 ENCOUNTER — Ambulatory Visit (INDEPENDENT_AMBULATORY_CARE_PROVIDER_SITE_OTHER): Payer: Medicaid Other | Admitting: Gastroenterology

## 2020-06-15 VITALS — BP 118/80 | HR 92 | Ht 75.0 in | Wt 238.1 lb

## 2020-06-15 DIAGNOSIS — G8929 Other chronic pain: Secondary | ICD-10-CM

## 2020-06-15 DIAGNOSIS — R1011 Right upper quadrant pain: Secondary | ICD-10-CM

## 2020-06-15 DIAGNOSIS — K200 Eosinophilic esophagitis: Secondary | ICD-10-CM | POA: Diagnosis not present

## 2020-06-15 DIAGNOSIS — K219 Gastro-esophageal reflux disease without esophagitis: Secondary | ICD-10-CM

## 2020-06-15 LAB — CBC
HCT: 43.4 % (ref 39.0–52.0)
Hemoglobin: 14.8 g/dL (ref 13.0–17.0)
MCHC: 34 g/dL (ref 30.0–36.0)
MCV: 91.4 fl (ref 78.0–100.0)
Platelets: 212 10*3/uL (ref 150.0–400.0)
RBC: 4.75 Mil/uL (ref 4.22–5.81)
RDW: 13.9 % (ref 11.5–15.5)
WBC: 7.6 10*3/uL (ref 4.0–10.5)

## 2020-06-15 LAB — LIPASE: Lipase: 28 U/L (ref 11.0–59.0)

## 2020-06-15 LAB — HEPATIC FUNCTION PANEL
ALT: 15 U/L (ref 0–53)
AST: 15 U/L (ref 0–37)
Albumin: 4.6 g/dL (ref 3.5–5.2)
Alkaline Phosphatase: 69 U/L (ref 39–117)
Bilirubin, Direct: 0.1 mg/dL (ref 0.0–0.3)
Total Bilirubin: 0.4 mg/dL (ref 0.2–1.2)
Total Protein: 7.6 g/dL (ref 6.0–8.3)

## 2020-06-15 MED ORDER — OMEPRAZOLE 40 MG PO CPDR: 40.0000 mg | DELAYED_RELEASE_CAPSULE | Freq: Two times a day (BID) | ORAL | 3 refills | Status: DC

## 2020-06-15 MED ORDER — FAMOTIDINE 20 MG PO TABS: 20.0000 mg | ORAL_TABLET | Freq: Every day | ORAL | 3 refills | Status: DC

## 2020-06-15 MED ORDER — ONDANSETRON HCL 4 MG PO TABS: 4.0000 mg | ORAL_TABLET | ORAL | 3 refills | Status: DC | PRN

## 2020-06-15 MED ORDER — FLUTICASONE PROPIONATE HFA 220 MCG/ACT IN AERO: 2.0000 | INHALATION_SPRAY | Freq: Two times a day (BID) | RESPIRATORY_TRACT | 12 refills | Status: DC

## 2020-06-15 NOTE — Progress Notes (Signed)
Referring Provider: Nicoletta Dress, MD Primary Care Physician:  Nicoletta Dress, MD  Chief complaint:  Abdominal pain   IMPRESSION:  Eosinophilic esophagitis  Reflux exacerbated despite medical therapy New RUQ pain Unintentional weight loss of 50 pounds Hyperplastic polyp on colonoscopy  Eosinophilic esophagitis: Reviewed diagnosis, natural history, and treatment of EOE. Continue PPI therapy. Trial of fluticasone. If not improving, will move forward with dietary restrictions. Will plan EGD with biopsies when symptoms have improved.  Abdominal pain: May be related to EOE. Ultrasound to evaluate for hepatobiliary disease.   PLAN: - Continue omeprazole 40 mg BID and famotidine 20 mg daily - Zofran 4 SL q4 hours PRN nausea (#50 with 3 refills) - CBC, hepatic function panel, lipase - Abdominal ultrasound to evaluate RUQ pain - Fluticasone 220 mcg/spray used two sprays twice daily administered using a metered dose inhaler without a spacer for 8 weeks - EGD in 12 weeks if clinically improving  Please see the "Patient Instructions" section for addition details about the plan.  HPI: Bob Roy is a 19 y.o. male referred by Dr. Delena Bali for further evaluation of abdominal pain and gastritis. The history is obtained through the patient and review of his referral records.  History of obesity, lifetime history of GERD, asthma, hypertenion, depression, umbilical hernia repair, and asthma with worsening symptoms after a difficult relationship break-up in March. Unintentionally lost 50 pounds and found his symptoms worsened during that time  Initially seen in consultation 02/2020 for episodes of reflux, diarrhea, and severe epigastric burning abdominal pain occuring 2-3 times every week. Day starts with "acidic eructations" followed by vomiting "my guts up" and diarrhea. Can last for 3-4 days at a time. Uses ibuprofen 1-2 times daily controls his pain. Rare blood on the toilet paper.  No mucous in the stool.   Omeprazole increased to 40 mg daily and added famotidine. No significant change in his symptoms despite medication changes. Similar symptoms evaluated in 2008 included a normal abdominal ultrasound. UGI series showed marked GE reflux.   Recent evaluation includes: - Previous CT during the evaluation of chest pain showed a peptic ulcer Oval Linsey) - Lipase and liver enzymes were normal - Referral records include abdominal x-ray 11/10/19 that was essentially normal  Endoscopic evaluation 04/08/20 included EGD and colonoscopy.  - EGD showed gastritis. Gastric and duodenal biopsies were normal. Biopsies confirmed eosinophilic esophagitis with >20 eosinophils per high power field  - Colonoscopy showed a 3mm ascending colon hyperplastic polyp. Colon biopsies were normal. TI biopsies were negative.  He has had some severe episodes of reflux since the endoscopy. Sometimes he even has difficulties with water.  Despite pantoprazole 40 mg BID and famotidine 20 QHS.   Now with a couple days of near constant abdominal pain. Feels like it's under his liver. Worsened by movement. No pleuritic component.   His pastor had what sounds like a Nissen fundoplication.  Past Medical History:  Diagnosis Date  . Anxiety   . Arthritis   . Asthma   . Depression   . Enlarged prostate   . GERD (gastroesophageal reflux disease)   . HTN (hypertension)   . Obesity   . Seasonal allergies     Past Surgical History:  Procedure Laterality Date  . CIRCUMCISION    . KNEE ARTHROSCOPY Left   . Radial fracture surgery Right   . VENTRAL HERNIA REPAIR    . WRIST ARTHROSCOPY Right     Current Outpatient Medications  Medication Sig Dispense Refill  .  celecoxib (CELEBREX) 200 MG capsule Take 1 capsule (200 mg total) by mouth 2 (two) times daily. 30 capsule 1  . cyclobenzaprine (FLEXERIL) 5 MG tablet Take 5 mg by mouth 3 (three) times daily.    . diclofenac (VOLTAREN) 75 MG EC tablet Take 1  tablet (75 mg total) by mouth 2 (two) times daily. 30 tablet 0  . dicyclomine (BENTYL) 20 MG tablet TAKE 1 TABLET BY MOUTH 4 TIMES DAILY - BEFORE MEALS AND AT BEDTIME. 360 tablet 1  . famotidine (PEPCID) 40 MG tablet Take 40 mg by mouth at bedtime.    . fluticasone (FLONASE) 50 MCG/ACT nasal spray Place 2 sprays into both nostrils daily.    . meloxicam (MOBIC) 15 MG tablet TAKE 1 TABLET BY MOUTH EVERY DAY 30 tablet 0  . omeprazole (PRILOSEC) 40 MG capsule TAKE 1 CAPSULE BY MOUTH TWICE A DAY 180 capsule 1  . tamsulosin (FLOMAX) 0.4 MG CAPS capsule Take 0.4 mg by mouth daily.     No current facility-administered medications for this visit.    Allergies as of 06/15/2020 - Review Complete 06/15/2020  Allergen Reaction Noted  . Amoxicillin Rash 05/09/2013  . Augmentin [amoxicillin-pot clavulanate] Rash 01/14/2015  . Sulfa antibiotics Rash 01/14/2015  . Sulfur Rash 05/09/2013    Family History  Problem Relation Age of Onset  . Migraines Mother        Resolved in adulthood  . Depression Mother   . Anxiety disorder Mother   . Breast cancer Mother        mets to liver  . Clotting disorder Mother   . Cancer Father   . Diabetes Father   . Irritable bowel syndrome Father   . Breast cancer Maternal Grandmother   . Ulcerative colitis Maternal Grandmother   . Cancer Paternal Grandmother        type unknown  . Prostate cancer Paternal Grandfather   . Kidney disease Paternal Grandfather   . Epilepsy Cousin   . Colon cancer Neg Hx   . Esophageal cancer Neg Hx   . Stomach cancer Neg Hx   . Rectal cancer Neg Hx     Social History   Socioeconomic History  . Marital status: Single    Spouse name: Not on file  . Number of children: 0  . Years of education: Not on file  . Highest education level: Not on file  Occupational History  . Occupation: Ship broker  Tobacco Use  . Smoking status: Former Smoker    Types: Cigarettes    Quit date: 2020    Years since quitting: 1.9  . Smokeless  tobacco: Former Systems developer    Types: Chew    Quit date: 2021  Vaping Use  . Vaping Use: Never used  Substance and Sexual Activity  . Alcohol use: Yes    Comment: occasional  . Drug use: No  . Sexual activity: Never    Birth control/protection: Abstinence  Other Topics Concern  . Not on file  Social History Narrative  . Not on file   Social Determinants of Health   Financial Resource Strain: Not on file  Food Insecurity: Not on file  Transportation Needs: Not on file  Physical Activity: Not on file  Stress: Not on file  Social Connections: Not on file  Intimate Partner Violence: Not on file     Physical Exam: General:   Alert,  well-nourished, pleasant and cooperative in NAD Head:  Normocephalic and atraumatic. Eyes:  Sclera clear, no icterus.  Conjunctiva pink. Abdomen:  Soft, mild RUQ tendeness with palation with associated guarding. No rebound. Liver edge is not palpable. nontender, nondistended, normal bowel sounds, no rebound or guarding. No hepatosplenomegaly.   Neurologic:  Alert and  oriented x4;  grossly nonfocal Skin:  Intact without significant lesions or rashes. Psych:  Alert and cooperative. Normal mood and affect.     Maeve Debord L. Tarri Glenn, MD, MPH 06/15/2020, 11:00 AM

## 2020-06-15 NOTE — Patient Instructions (Addendum)
The biopsies of your esophagus shows eosinophilic esophagitis.  This is an inflammatory condition of the esophagus.  It is characterized by the presence of eosinophils (a type of white blood cell associated with allergic reactions) in the wall of the esophagus, where they stimulate inflammation. In addition to heartburn, eosinophilic esophagitis can cause dysphagia, the feeling of food or pills sticking in your esophagus. It is often associated with allergies or asthma and is thought to be related to an underlying food allergy.   You have been scheduled for an abdominal ultrasound at [facility] Radiology (1st floor of hospital) on 06/22/20 at 8:30am. Please arrive 15 minutes prior to your appointment for registration. Do not eat or drink after midnight. Should you need to reschedule your appointment, please contact radiology at [phone]. This test typically takes about 30 minutes to perform.  LABS: Your provider has requested that you go to the basement level for lab work before leaving today. Press "B" on the elevator. The lab is located at the first door on the left as you exit the elevator.  HEALTHCARE LAWS AND MY CHART RESULTS: Due to recent changes in healthcare laws, you may see the results of your imaging and laboratory studies on MyChart before your provider has had a chance to review them.  We understand that in some cases there may be results that are confusing or concerning to you. Not all laboratory results come back in the same time frame and the provider may be waiting for multiple results in order to interpret others.  Please give [place] 48 hours in order for your provider to thoroughly review all the results before contacting the office for clarification of your results.   You should take an acid suppressing medication such as pantoprazole or omeprazole every day.  PRESCRIPTION MEDICATION(S): We have sent the following medication(s) to your pharmacy:  . Pantoprazole - please take 40mg  by  mouth twice daily . Famotidine - please take 20mg  by mouth daily . Zofran 4mg  - please take 1 tablet by mouth every 4 hours as needed for nausea  I have recommended a trial of Fluticasone 220 mcg/spray used two sprays twice daily administered using a metered dose inhaler without a spacer for 8 weeks. The medication is sprayed into the mouth and then swallowed. You should not inhale while the medication is being delivered, and you should not eat or drink for 30-60 minutes following administration.  PRESCRIPTION MEDICATION(S): We have sent the following medication(s) to your pharmacy:  . Fluticasone 234mcg - Please use 2 sprays twice daily administered using a metered dose inhaler without a spacer for 8 weeks   Foods may trigger your eosinophilic esophagitis. Elimination diet involves removing the most common foods in allergic disease. I recommend eliminating dairy and  gluten-containing grains from your diet for 6 weeks f you aren't feeling better with fluticasone.   We should plan another EGD with biopsies in about 3 months - assuming that your symptoms are improved at that time. . If esophageal biopsies are positive for eosinophils, will make additional dietary changes.  Good resources for more information about eosinophilic esophagitis include: The American Gastroenterology Association: SpoolDirect.pl UpToDate.com has information titled Eosinophilic eophagitis (The Basics)  I would also like to do some testing given your new abdominal pain. Please stop by the lab for some testing today. We will arrange for an abdominal ultrasound to look at your liver, gallbladder, and pancreas.   If you are age 21 or younger, your body mass index  should be between 19-25. Your There is no height or weight on file to calculate BMI. If this is out of the aformentioned range listed, please consider follow up with your Primary Care  Provider.   Thank you for trusting me with your gastrointestinal care!    Thornton Park, MD, MPH

## 2020-06-16 ENCOUNTER — Other Ambulatory Visit: Payer: Self-pay

## 2020-06-16 MED ORDER — PANTOPRAZOLE SODIUM 40 MG PO TBEC: 40.0000 mg | DELAYED_RELEASE_TABLET | Freq: Two times a day (BID) | ORAL | 3 refills | Status: DC

## 2020-06-17 ENCOUNTER — Telehealth: Payer: Self-pay

## 2020-06-22 ENCOUNTER — Ambulatory Visit (HOSPITAL_COMMUNITY)
Admission: RE | Admit: 2020-06-22 | Discharge: 2020-06-22 | Disposition: A | Discharge status: Home / Self Care | Payer: Medicaid Other | Source: Ambulatory Visit | Attending: Gastroenterology | Admitting: Gastroenterology

## 2020-06-22 ENCOUNTER — Other Ambulatory Visit: Payer: Self-pay

## 2020-06-22 DIAGNOSIS — R1011 Right upper quadrant pain: Secondary | ICD-10-CM | POA: Diagnosis present

## 2020-06-22 DIAGNOSIS — K200 Eosinophilic esophagitis: Secondary | ICD-10-CM | POA: Insufficient documentation

## 2020-06-22 DIAGNOSIS — G8929 Other chronic pain: Secondary | ICD-10-CM | POA: Diagnosis present

## 2020-06-22 DIAGNOSIS — K219 Gastro-esophageal reflux disease without esophagitis: Secondary | ICD-10-CM | POA: Diagnosis present

## 2020-07-14 ENCOUNTER — Encounter: Payer: Self-pay | Admitting: Sports Medicine

## 2020-07-14 ENCOUNTER — Ambulatory Visit (INDEPENDENT_AMBULATORY_CARE_PROVIDER_SITE_OTHER): Payer: Medicaid Other | Admitting: Sports Medicine

## 2020-07-14 ENCOUNTER — Ambulatory Visit (INDEPENDENT_AMBULATORY_CARE_PROVIDER_SITE_OTHER): Payer: Medicaid Other

## 2020-07-14 ENCOUNTER — Other Ambulatory Visit: Payer: Self-pay

## 2020-07-14 DIAGNOSIS — M7751 Other enthesopathy of right foot: Secondary | ICD-10-CM | POA: Diagnosis not present

## 2020-07-14 DIAGNOSIS — M25371 Other instability, right ankle: Secondary | ICD-10-CM | POA: Diagnosis not present

## 2020-07-14 DIAGNOSIS — Q688 Other specified congenital musculoskeletal deformities: Secondary | ICD-10-CM

## 2020-07-14 DIAGNOSIS — M2141 Flat foot [pes planus] (acquired), right foot: Secondary | ICD-10-CM

## 2020-07-14 DIAGNOSIS — G8929 Other chronic pain: Secondary | ICD-10-CM

## 2020-07-14 DIAGNOSIS — M2142 Flat foot [pes planus] (acquired), left foot: Secondary | ICD-10-CM

## 2020-07-14 DIAGNOSIS — M779 Enthesopathy, unspecified: Secondary | ICD-10-CM

## 2020-07-14 DIAGNOSIS — M898X9 Other specified disorders of bone, unspecified site: Secondary | ICD-10-CM

## 2020-07-14 DIAGNOSIS — M25571 Pain in right ankle and joints of right foot: Secondary | ICD-10-CM

## 2020-07-14 DIAGNOSIS — M19071 Primary osteoarthritis, right ankle and foot: Secondary | ICD-10-CM

## 2020-07-14 NOTE — Progress Notes (Signed)
Subjective:  RYVER POBLETE is a 20 y.o. male patient who returns to office for follow-up evaluation foot and ankle pain. Reports still has pain, meds, powersteps, cam boot has not helped. R>L still hurts and feels unstable. Request to discuss surgery since in past has not did good with PT. No other issues noted.  Patient Active Problem List   Diagnosis Date Noted  . Contusion of right hand 05/05/2020  . Right wrist pain 09/19/2019  . Lumbar back pain 09/10/2019  . 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 Traumatic tear of triangular fibrocartilage complex of right wrist 07/10/2019  . Umbilical pain 62/37/6283  . Postoperative examination 02/20/2018  . Ventral hernia without obstruction or gangrene 02/06/2018  . Myofascial pain 04/25/2016  . Asthma 03/23/2015  . AR (allergic rhinitis) 03/23/2015  . 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 Other secondary hypertension 01/14/2015  . Postconcussion syndrome 01/14/2015  . Migraine without aura and without status migrainosus, not intractable 01/14/2015  . Chronic daily headache 01/14/2015  . Medication overuse headache 01/14/2015  . 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 Left anterior knee pain 12/16/2014    Current Outpatient Medications on File Prior to Visit  Medication Sig Dispense Refill  . celecoxib (CELEBREX) 200 MG capsule Take 1 capsule (200 mg total) by mouth 2 (two) times daily. 30 capsule 1  . cyclobenzaprine (FLEXERIL) 5 MG tablet Take 5 mg by mouth 3 (three) times daily.    . diclofenac (VOLTAREN) 75 MG EC tablet Take 1 tablet (75 mg total) by mouth 2 (two) times daily. 30 tablet 0  . dicyclomine (BENTYL) 20 MG tablet TAKE 1 TABLET BY MOUTH 4 TIMES DAILY - BEFORE MEALS AND AT BEDTIME. 360 tablet 1  . famotidine (PEPCID) 20 MG tablet Take 1 tablet (20 mg total) by mouth daily. 90 tablet 3  . fluticasone (FLONASE) 50  MCG/ACT nasal spray Place 2 sprays into both nostrils daily.    . fluticasone (FLOVENT HFA) 220 MCG/ACT inhaler Inhale 2 puffs into the lungs 2 (two) times daily. Administer using metered dose inhaler without spacer x 8 weeks 1 each 12  . meloxicam (MOBIC) 15 MG tablet TAKE 1 TABLET BY MOUTH EVERY DAY 30 tablet 0  . omeprazole (PRILOSEC) 40 MG capsule Take 1 capsule (40 mg total) by mouth 2 (two) times daily. 180 capsule 3  . ondansetron (ZOFRAN) 4 MG tablet Take 1 tablet (4 mg total) by mouth every 4 (four) hours as needed for nausea or vomiting (nausea). 50 tablet 3  . pantoprazole (PROTONIX) 40 MG tablet Take 1 tablet (40 mg total) by mouth 2 (two) times daily. 60 tablet 3  . tamsulosin (FLOMAX) 0.4 MG CAPS capsule Take 0.4 mg by mouth daily.     No current facility-administered medications on file prior to visit.    Allergies  Allergen Reactions  . Amoxicillin Rash  . Augmentin [Amoxicillin-Pot Clavulanate] Rash  . Sulfa Antibiotics Rash  . Sulfur Rash    Objective:  General: Alert and oriented x3 in no acute distress  Dermatology: No open lesions bilateral lower extremities, no webspace macerations, no ecchymosis bilateral, all nails x 10 are well manicured.  Vascular: Dorsalis Pedis and Posterior Tibial pedal pulses palpable, Capillary Fill Time 3 seconds,(+) pedal hair growth bilateral, no edema bilateral lower extremities, Temperature gradient within normal limits.  Neurology: Gross sensation intact via light touch bilateral.  Musculoskeletal: Mild tenderness with palpation at lateral  ankle/sinus tarsi and along the peroneal tendon there is also pain of noted to the medial ankle at the medial ankle gutter on the right and TN joint, negative talar tilt, Negative tib-fib stress, subjective instability and clicking right ankle but no audible clicking on range of motion at today's exam like previous. No pain with calf compression bilateral. Range of motion within normal limits with  mild guarding on right ankle. Mild pain at left lateral ankle/sinus tarsi. Strength within normal limits in all groups bilateral.    Assessment and Plan: Problem List Items Addressed This Visit   None   Visit Diagnoses    Tendinitis    -  Primary   Relevant Orders   DG Foot Complete Left   Capsulitis of ankle, right       Chronic pain of right ankle       Ankle instability, right       Os trigonum syndrome       Arthritis of right foot       Bony exostosis       Pes planus of both feet          -Complete examination performed -Re-Discussed treatement options for history of chronic pain and ankle with subjective instability and pain R>L -Left xray consistent with pes planus -Patient declined PT at this time -Patient opt for surgical management. Consent obtained for right ankle arthrotomy with arthroscopic debridement and removal of bone spur/arthritis at TN joint right foot. Pre and Post op course explained. Risks, benefits, alternatives explained. No guarantees given or implied. Surgical booking slip submitted and provided patient with Surgical packet and info for [place] -To dispense new CAM Walker at crutches at surgical center; must be nonweightbearing at minimum 1 week -Advised patient even this surgery may not completely relieve symptoms A lot of his pain is mechanical secondary to flatfoot -Meanwhile continue with powersteps with felt kinetic wedge and recommend OTC ankle brace lace up ASO/trilock -Recommend rest, ice, elevation, and OTC NSAIDs PRN -Patient to return to office after surgery or sooner if condition worsens.  Landis Martins, DPM

## 2020-07-21 ENCOUNTER — Other Ambulatory Visit: Payer: Self-pay | Admitting: Gastroenterology

## 2020-07-21 ENCOUNTER — Other Ambulatory Visit: Payer: Self-pay | Admitting: Sports Medicine

## 2020-08-20 ENCOUNTER — Telehealth: Payer: Self-pay | Admitting: Sports Medicine

## 2020-08-20 NOTE — Telephone Encounter (Signed)
DOS: 08/31/2020  Procedures: Tarsal Exostectomy Rt (68257) and Ankle Arthroscopy with Possible Scope Rt [phone])  [employer] Effective Jan 01, 2020 - May 02, 2021  Copay: $3  Per [employer] and [employer] Prior Authorization is Not Required. Request Tracking ID# [id number].

## 2020-08-24 ENCOUNTER — Encounter: Payer: Medicaid Other | Admitting: Sports Medicine

## 2020-08-28 ENCOUNTER — Other Ambulatory Visit: Payer: Self-pay | Admitting: Sports Medicine

## 2020-08-28 DIAGNOSIS — Z9889 Other specified postprocedural states: Secondary | ICD-10-CM

## 2020-08-28 NOTE — Progress Notes (Signed)
Post op meds entered 

## 2020-08-30 MED ORDER — HYDROCODONE-ACETAMINOPHEN 10-325 MG PO TABS: 1.0000 | ORAL_TABLET | Freq: Four times a day (QID) | ORAL | 0 refills | Status: DC | PRN

## 2020-08-30 MED ORDER — IBUPROFEN 800 MG PO TABS: 800.0000 mg | ORAL_TABLET | Freq: Three times a day (TID) | ORAL | 0 refills | Status: DC | PRN

## 2020-08-30 MED ORDER — DOCUSATE SODIUM 100 MG PO CAPS: 100.0000 mg | ORAL_CAPSULE | Freq: Two times a day (BID) | ORAL | 0 refills | Status: DC

## 2020-08-30 MED ORDER — PROMETHAZINE HCL 25 MG PO TABS: 25.0000 mg | ORAL_TABLET | Freq: Three times a day (TID) | ORAL | 0 refills | Status: DC | PRN

## 2020-08-31 ENCOUNTER — Encounter: Payer: Self-pay | Admitting: Sports Medicine

## 2020-08-31 DIAGNOSIS — M25774 Osteophyte, right foot: Secondary | ICD-10-CM | POA: Diagnosis not present

## 2020-08-31 DIAGNOSIS — D492 Neoplasm of unspecified behavior of bone, soft tissue, and skin: Secondary | ICD-10-CM

## 2020-08-31 DIAGNOSIS — M19071 Primary osteoarthritis, right ankle and foot: Secondary | ICD-10-CM | POA: Diagnosis not present

## 2020-08-31 DIAGNOSIS — M65871 Other synovitis and tenosynovitis, right ankle and foot: Secondary | ICD-10-CM | POA: Diagnosis not present

## 2020-09-01 ENCOUNTER — Telehealth: Payer: Self-pay | Admitting: Sports Medicine

## 2020-09-01 ENCOUNTER — Encounter: Payer: Medicaid Other | Admitting: Sports Medicine

## 2020-09-01 NOTE — Telephone Encounter (Signed)
Postoperative check phone call made to patient.  Patient reports that he is doing well his leg is still numb from the nerve block and he does not have any major pain.  Patient reports that he has been staying ahead of the pain and taking his pain medication as directed.  Patient denies nausea vomiting fever chills or any other constitutional symptoms at this time.  I advised patient to continue with rest ice elevation.  Patient question how surgery went on yesterday and I informed him that the procedure went well and I was able to clean up some inflamed tissue at the ankle as well as place stronger sutures at the ankle to provide additional stability as well as move down a bone spur at the top of the foot.  Patient expressed thanks and I advised patient to continue with his crutches nonweightbearing and limited activity only up for bathroom.  I reminded patient of his follow-up appointment as scheduled next week and advised him to call office if he has any other problems or issues or concerns prior to visit. -Dr. Cannon Kettle

## 2020-09-02 ENCOUNTER — Ambulatory Visit: Payer: Medicaid Other | Admitting: Allergy and Immunology

## 2020-09-07 ENCOUNTER — Ambulatory Visit (INDEPENDENT_AMBULATORY_CARE_PROVIDER_SITE_OTHER): Payer: Medicaid Other | Admitting: Sports Medicine

## 2020-09-07 ENCOUNTER — Encounter: Payer: Self-pay | Admitting: Sports Medicine

## 2020-09-07 ENCOUNTER — Other Ambulatory Visit: Payer: Self-pay | Admitting: Sports Medicine

## 2020-09-07 ENCOUNTER — Other Ambulatory Visit: Payer: Self-pay

## 2020-09-07 ENCOUNTER — Ambulatory Visit (INDEPENDENT_AMBULATORY_CARE_PROVIDER_SITE_OTHER): Payer: Medicaid Other

## 2020-09-07 DIAGNOSIS — M898X9 Other specified disorders of bone, unspecified site: Secondary | ICD-10-CM

## 2020-09-07 DIAGNOSIS — Z9889 Other specified postprocedural states: Secondary | ICD-10-CM

## 2020-09-07 DIAGNOSIS — M19071 Primary osteoarthritis, right ankle and foot: Secondary | ICD-10-CM

## 2020-09-07 DIAGNOSIS — M779 Enthesopathy, unspecified: Secondary | ICD-10-CM

## 2020-09-07 DIAGNOSIS — G8929 Other chronic pain: Secondary | ICD-10-CM

## 2020-09-07 DIAGNOSIS — M25371 Other instability, right ankle: Secondary | ICD-10-CM

## 2020-09-07 DIAGNOSIS — M7751 Other enthesopathy of right foot: Secondary | ICD-10-CM

## 2020-09-07 DIAGNOSIS — M25571 Pain in right ankle and joints of right foot: Secondary | ICD-10-CM

## 2020-09-07 MED ORDER — IBUPROFEN 800 MG PO TABS: 800.0000 mg | ORAL_TABLET | Freq: Three times a day (TID) | ORAL | 0 refills | Status: DC | PRN

## 2020-09-07 MED ORDER — HYDROCODONE-ACETAMINOPHEN 10-325 MG PO TABS: 1.0000 | ORAL_TABLET | Freq: Four times a day (QID) | ORAL | 0 refills | Status: AC | PRN

## 2020-09-07 MED ORDER — CLINDAMYCIN HCL 300 MG PO CAPS: 300.0000 mg | ORAL_CAPSULE | Freq: Three times a day (TID) | ORAL | 0 refills | Status: DC

## 2020-09-07 NOTE — Progress Notes (Signed)
Subjective: Bob Roy is a 20 y.o. male patient seen today in office for POV #1 (DOS 08/31/2020), S/P right ankle arthrotomy and tarsal exostectomy with application of splint. Patient denies pain at surgical site right now but has been painful 7 out of 10 worse in the evening states that he did remove his splint dressing and change the dressing 3-4 times since surgery and has noticed a little bit of redness at the top side of his foot and ankle, denies calf pain, denies headache, chest pain, shortness of breath, nausea, vomiting, fever, or chills. No other issues noted.   Patient Active Problem List   Diagnosis Date Noted  . Contusion of right hand 05/05/2020  . Right wrist pain 09/19/2019  . Lumbar back pain 09/10/2019  . 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 Traumatic tear of triangular fibrocartilage complex of right wrist 07/10/2019  . Umbilical pain 84/13/2440  . Postoperative examination 02/20/2018  . Ventral hernia without obstruction or gangrene 02/06/2018  . Myofascial pain 04/25/2016  . Asthma 03/23/2015  . AR (allergic rhinitis) 03/23/2015  . 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 Other secondary hypertension 01/14/2015  . Postconcussion syndrome 01/14/2015  . Migraine without aura and without status migrainosus, not intractable 01/14/2015  . Chronic daily headache 01/14/2015  . Medication overuse headache 01/14/2015  . 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 Left anterior knee pain 12/16/2014    Current Outpatient Medications on File Prior to Visit  Medication Sig Dispense Refill  . celecoxib (CELEBREX) 200 MG capsule Take 1 capsule (200 mg total) by mouth 2 (two) times daily. 30 capsule 1  . cyclobenzaprine (FLEXERIL) 5 MG tablet Take 5 mg by mouth 3 (three) times daily.    . diclofenac (VOLTAREN) 75 MG EC tablet Take 1 tablet (75 mg total) by mouth 2  (two) times daily. 30 tablet 0  . dicyclomine (BENTYL) 20 MG tablet TAKE 1 TABLET BY MOUTH 4 TIMES DAILY - BEFORE MEALS AND AT BEDTIME. 360 tablet 1  . docusate sodium (COLACE) 100 MG capsule Take 1 capsule (100 mg total) by mouth 2 (two) times daily. 10 capsule 0  . famotidine (PEPCID) 20 MG tablet Take 1 tablet (20 mg total) by mouth daily. 90 tablet 3  . fluticasone (FLONASE) 50 MCG/ACT nasal spray Place 2 sprays into both nostrils daily.    . fluticasone (FLOVENT HFA) 220 MCG/ACT inhaler Inhale 2 puffs into the lungs 2 (two) times daily. Administer using metered dose inhaler without spacer x 8 weeks 1 each 12  . meloxicam (MOBIC) 15 MG tablet TAKE 1 TABLET BY MOUTH EVERY DAY 30 tablet 0  . omeprazole (PRILOSEC) 40 MG capsule Take 1 capsule (40 mg total) by mouth 2 (two) times daily. 180 capsule 3  . ondansetron (ZOFRAN) 4 MG tablet Take 1 tablet (4 mg total) by mouth every 4 (four) hours as needed for nausea or vomiting (nausea). 50 tablet 3  . pantoprazole (PROTONIX) 40 MG tablet TAKE 1 TABLET BY MOUTH TWICE A DAY 180 tablet 1  . promethazine (PHENERGAN) 25 MG tablet Take 1 tablet (25 mg total) by mouth every 8 (eight) hours as needed for nausea or vomiting. 20 tablet 0  . tamsulosin (FLOMAX) 0.4 MG CAPS capsule Take 0.4 mg by mouth daily.     No current facility-administered medications on file prior to visit.    Allergies  Allergen Reactions  . Amoxicillin Rash  .  Augmentin [Amoxicillin-Pot Clavulanate] Rash  . Elemental Sulfur Rash  . Sulfa Antibiotics Rash    Objective: There were no vitals filed for this visit.  General: No acute distress, AAOx3  Right foot: Sutures and staples intact with no gapping or dehiscence at surgical site, mild swelling to right foot especially dorsal lateral at ankle, blanchable erythema, no warmth, no drainage, no other signs of infection noted, Capillary fill time <3 seconds in all digits, gross sensation present via light touch to right foot.   Guarding to right foot and ankle.  Post Op Xray, Right foot no acute osseous findings, evidence of exostectomy midfoot, soft tissue swelling within normal limits for post op status.   Assessment and Plan:  Problem List Items Addressed This Visit   None   Visit Diagnoses    Tendinitis    -  Primary   S/P foot surgery, right       Relevant Medications   HYDROcodone-acetaminophen (NORCO) 10-325 MG tablet   ibuprofen (ADVIL) 800 MG tablet   Capsulitis of ankle, right       Relevant Medications   HYDROcodone-acetaminophen (NORCO) 10-325 MG tablet   ibuprofen (ADVIL) 800 MG tablet   Chronic pain of right ankle       Relevant Medications   HYDROcodone-acetaminophen (NORCO) 10-325 MG tablet   ibuprofen (ADVIL) 800 MG tablet   Ankle instability, right       Bony exostosis       Arthritis of right foot       Relevant Medications   HYDROcodone-acetaminophen (NORCO) 10-325 MG tablet   ibuprofen (ADVIL) 800 MG tablet       -Patient seen and evaluated -X-rays reviewed -Applied Unna boot and dry sterile dressing to surgical site right foot secured with ACE wrap and stockinet  -Advised patient to make sure to keep dressings clean, dry, and intact to right surgical site and do not remove dressings -Advised patient to continue with crutches and nonweightbearing -Advised patient to limit activity to necessity  -Advised patient to ice and elevate as instructed -Refill hydrocodone and ibuprofen to use for pain -Prescribed clindamycin antibiotics due to localized redness and erythema along the right dorsal lateral incision -Will plan for possible suture removal at next office visit. In the meantime, patient to call office if any issues or problems arise.  -Advised continue with no work expected time out 1 month  Landis Martins, DPM

## 2020-09-14 ENCOUNTER — Encounter: Payer: Self-pay | Admitting: Sports Medicine

## 2020-09-14 ENCOUNTER — Other Ambulatory Visit: Payer: Self-pay

## 2020-09-14 ENCOUNTER — Ambulatory Visit (INDEPENDENT_AMBULATORY_CARE_PROVIDER_SITE_OTHER): Payer: Medicaid Other | Admitting: Sports Medicine

## 2020-09-14 DIAGNOSIS — M25371 Other instability, right ankle: Secondary | ICD-10-CM

## 2020-09-14 DIAGNOSIS — M779 Enthesopathy, unspecified: Secondary | ICD-10-CM

## 2020-09-14 DIAGNOSIS — M25571 Pain in right ankle and joints of right foot: Secondary | ICD-10-CM

## 2020-09-14 DIAGNOSIS — M898X9 Other specified disorders of bone, unspecified site: Secondary | ICD-10-CM

## 2020-09-14 DIAGNOSIS — Z9889 Other specified postprocedural states: Secondary | ICD-10-CM

## 2020-09-14 DIAGNOSIS — G8929 Other chronic pain: Secondary | ICD-10-CM

## 2020-09-14 DIAGNOSIS — M7751 Other enthesopathy of right foot: Secondary | ICD-10-CM

## 2020-09-14 NOTE — Progress Notes (Signed)
Subjective: Bob Roy is a 20 y.o. male patient seen today in office for POV #2 (DOS 08/31/2020), S/P right ankle arthrotomy and tarsal exostectomy with application of splint. Patient denies pain at surgical site right now but has been painful 6 out of 10 less swelling and removed the unna boot on Sunday and saw clear to yellow drainage, denies calf pain, denies headache, chest pain, shortness of breath, nausea, vomiting, fever, or chills. No other issues noted.   Admits that he needs note for work.   Patient Active Problem List   Diagnosis Date Noted  . Contusion of right hand 05/05/2020  . Right wrist pain 09/19/2019  . Lumbar back pain 09/10/2019  . 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 Traumatic tear of triangular fibrocartilage complex of right wrist 07/10/2019  . Umbilical pain 84/13/2440  . Postoperative examination 02/20/2018  . Ventral hernia without obstruction or gangrene 02/06/2018  . Myofascial pain 04/25/2016  . Asthma 03/23/2015  . AR (allergic rhinitis) 03/23/2015  . 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 Other secondary hypertension 01/14/2015  . Postconcussion syndrome 01/14/2015  . Migraine without aura and without status migrainosus, not intractable 01/14/2015  . Chronic daily headache 01/14/2015  . Medication overuse headache 01/14/2015  . 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 Left anterior knee pain 12/16/2014    Current Outpatient Medications on File Prior to Visit  Medication Sig Dispense Refill  . celecoxib (CELEBREX) 200 MG capsule Take 1 capsule (200 mg total) by mouth 2 (two) times daily. 30 capsule 1  . clindamycin (CLEOCIN) 300 MG capsule Take 1 capsule (300 mg total) by mouth 3 (three) times daily. 30 capsule 0  . cyclobenzaprine (FLEXERIL) 5 MG tablet Take 5 mg by mouth 3 (three) times daily.    . diclofenac (VOLTAREN) 75 MG EC  tablet Take 1 tablet (75 mg total) by mouth 2 (two) times daily. 30 tablet 0  . dicyclomine (BENTYL) 20 MG tablet TAKE 1 TABLET BY MOUTH 4 TIMES DAILY - BEFORE MEALS AND AT BEDTIME. 360 tablet 1  . docusate sodium (COLACE) 100 MG capsule Take 1 capsule (100 mg total) by mouth 2 (two) times daily. 10 capsule 0  . famotidine (PEPCID) 20 MG tablet Take 1 tablet (20 mg total) by mouth daily. 90 tablet 3  . fluticasone (FLONASE) 50 MCG/ACT nasal spray Place 2 sprays into both nostrils daily.    . fluticasone (FLOVENT HFA) 220 MCG/ACT inhaler Inhale 2 puffs into the lungs 2 (two) times daily. Administer using metered dose inhaler without spacer x 8 weeks 1 each 12  . HYDROcodone-acetaminophen (NORCO) 10-325 MG tablet Take 1 tablet by mouth every 6 (six) hours as needed for up to 7 days. 28 tablet 0  . ibuprofen (ADVIL) 800 MG tablet Take 1 tablet (800 mg total) by mouth every 8 (eight) hours as needed. 30 tablet 0  . meloxicam (MOBIC) 15 MG tablet TAKE 1 TABLET BY MOUTH EVERY DAY 30 tablet 0  . omeprazole (PRILOSEC) 40 MG capsule Take 1 capsule (40 mg total) by mouth 2 (two) times daily. 180 capsule 3  . ondansetron (ZOFRAN) 4 MG tablet Take 1 tablet (4 mg total) by mouth every 4 (four) hours as needed for nausea or vomiting (nausea). 50 tablet 3  . pantoprazole (PROTONIX) 40 MG tablet TAKE 1 TABLET BY MOUTH TWICE A DAY 180 tablet 1  . promethazine (PHENERGAN) 25 MG tablet Take 1  tablet (25 mg total) by mouth every 8 (eight) hours as needed for nausea or vomiting. 20 tablet 0  . tamsulosin (FLOMAX) 0.4 MG CAPS capsule Take 0.4 mg by mouth daily.     No current facility-administered medications on file prior to visit.    Allergies  Allergen Reactions  . Amoxicillin Rash  . Augmentin [Amoxicillin-Pot Clavulanate] Rash  . Elemental Sulfur Rash  . Sulfa Antibiotics Rash    Objective: There were no vitals filed for this visit.  General: No acute distress, AAOx3  Right foot: Sutures and staples  intact with no gapping or dehiscence at surgical site, mild blistering to lateral incision, mild swelling to right foot especially dorsal lateral at ankle, blanchable erythema, no warmth, no drainage, no other signs of infection noted, Capillary fill time <3 seconds in all digits, gross sensation present via light touch to right foot.  Guarding to right foot and ankle.  Assessment and Plan:  Problem List Items Addressed This Visit   None   Visit Diagnoses    S/P foot surgery, right    -  Primary   Tendinitis       Capsulitis of ankle, right       Chronic pain of right ankle       Ankle instability, right       Bony exostosis            -Patient seen and evaluated -Applied betadine and dry dressing to right foot and ankle -Advised patient to change bandage to using -Advised patient to continue with crutches and nonweightbearing -Advised patient to limit activity to necessity  -Advised patient to ice and elevate as instructed -Continue with hydrocodone and ibuprofen to use for pain -Continue with clindamycin antibiotics due to localized redness and erythema along the right dorsal lateral incision -Will plan for possible suture removal at next office visit. In the meantime, patient to call office if any issues or problems arise.  -Advised continue with no work expected return on 4/18 light duty.   Landis Martins, DPM

## 2020-09-15 ENCOUNTER — Encounter: Payer: Medicaid Other | Admitting: Sports Medicine

## 2020-09-21 ENCOUNTER — Other Ambulatory Visit: Payer: Self-pay

## 2020-09-21 ENCOUNTER — Ambulatory Visit (INDEPENDENT_AMBULATORY_CARE_PROVIDER_SITE_OTHER): Payer: Medicaid Other | Admitting: Sports Medicine

## 2020-09-21 ENCOUNTER — Encounter: Payer: Self-pay | Admitting: Sports Medicine

## 2020-09-21 ENCOUNTER — Other Ambulatory Visit: Payer: Self-pay | Admitting: Sports Medicine

## 2020-09-21 DIAGNOSIS — M25571 Pain in right ankle and joints of right foot: Secondary | ICD-10-CM

## 2020-09-21 DIAGNOSIS — Z9889 Other specified postprocedural states: Secondary | ICD-10-CM

## 2020-09-21 DIAGNOSIS — M7751 Other enthesopathy of right foot: Secondary | ICD-10-CM

## 2020-09-21 DIAGNOSIS — M898X9 Other specified disorders of bone, unspecified site: Secondary | ICD-10-CM

## 2020-09-21 DIAGNOSIS — L02619 Cutaneous abscess of unspecified foot: Secondary | ICD-10-CM

## 2020-09-21 DIAGNOSIS — G8929 Other chronic pain: Secondary | ICD-10-CM

## 2020-09-21 DIAGNOSIS — M25371 Other instability, right ankle: Secondary | ICD-10-CM

## 2020-09-21 DIAGNOSIS — M779 Enthesopathy, unspecified: Secondary | ICD-10-CM

## 2020-09-21 DIAGNOSIS — L03119 Cellulitis of unspecified part of limb: Secondary | ICD-10-CM

## 2020-09-21 DIAGNOSIS — M2550 Pain in unspecified joint: Secondary | ICD-10-CM

## 2020-09-21 MED ORDER — IBUPROFEN 800 MG PO TABS: 800.0000 mg | ORAL_TABLET | Freq: Three times a day (TID) | ORAL | 0 refills | Status: DC | PRN

## 2020-09-21 MED ORDER — HYDROCODONE-ACETAMINOPHEN 10-325 MG PO TABS
1.0000 | ORAL_TABLET | Freq: Four times a day (QID) | ORAL | 0 refills | Status: DC | PRN
Start: 2020-09-21 — End: 2020-09-27

## 2020-09-21 NOTE — Progress Notes (Signed)
Subjective: Bob Roy is a 20 y.o. male patient seen today in office for POV #3 (DOS 08/31/2020), S/P right ankle arthrotomy and tarsal exostectomy with application of splint. Patient admits some pain and states that a stitch came out at the top of his right foot and he has noticed more drainage.  Denies calf pain, denies headache, chest pain, shortness of breath, nausea, vomiting, fever, or chills. No other issues noted.   Patient Active Problem List   Diagnosis Date Noted  . Contusion of right hand 05/05/2020  . Right wrist pain 09/19/2019  . Lumbar back pain 09/10/2019  . 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 Traumatic tear of triangular fibrocartilage complex of right wrist 07/10/2019  . Umbilical pain 47/65/4650  . Postoperative examination 02/20/2018  . Ventral hernia without obstruction or gangrene 02/06/2018  . Myofascial pain 04/25/2016  . Asthma 03/23/2015  . AR (allergic rhinitis) 03/23/2015  . 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 Other secondary hypertension 01/14/2015  . Postconcussion syndrome 01/14/2015  . Migraine without aura and without status migrainosus, not intractable 01/14/2015  . Chronic daily headache 01/14/2015  . Medication overuse headache 01/14/2015  . 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 Left anterior knee pain 12/16/2014    Current Outpatient Medications on File Prior to Visit  Medication Sig Dispense Refill  . celecoxib (CELEBREX) 200 MG capsule Take 1 capsule (200 mg total) by mouth 2 (two) times daily. 30 capsule 1  . clindamycin (CLEOCIN) 300 MG capsule Take 1 capsule (300 mg total) by mouth 3 (three) times daily. 30 capsule 0  . cyclobenzaprine (FLEXERIL) 5 MG tablet Take 5 mg by mouth 3 (three) times daily.    . diclofenac (VOLTAREN) 75 MG EC tablet Take 1 tablet (75 mg total) by mouth 2 (two) times daily. 30 tablet 0  .  dicyclomine (BENTYL) 20 MG tablet TAKE 1 TABLET BY MOUTH 4 TIMES DAILY - BEFORE MEALS AND AT BEDTIME. 360 tablet 1  . docusate sodium (COLACE) 100 MG capsule Take 1 capsule (100 mg total) by mouth 2 (two) times daily. 10 capsule 0  . famotidine (PEPCID) 20 MG tablet Take 1 tablet (20 mg total) by mouth daily. 90 tablet 3  . fluticasone (FLONASE) 50 MCG/ACT nasal spray Place 2 sprays into both nostrils daily.    . fluticasone (FLOVENT HFA) 220 MCG/ACT inhaler Inhale 2 puffs into the lungs 2 (two) times daily. Administer using metered dose inhaler without spacer x 8 weeks 1 each 12  . meloxicam (MOBIC) 15 MG tablet TAKE 1 TABLET BY MOUTH EVERY DAY 30 tablet 0  . omeprazole (PRILOSEC) 40 MG capsule Take 1 capsule (40 mg total) by mouth 2 (two) times daily. 180 capsule 3  . ondansetron (ZOFRAN) 4 MG tablet Take 1 tablet (4 mg total) by mouth every 4 (four) hours as needed for nausea or vomiting (nausea). 50 tablet 3  . pantoprazole (PROTONIX) 40 MG tablet TAKE 1 TABLET BY MOUTH TWICE A DAY 180 tablet 1  . promethazine (PHENERGAN) 25 MG tablet Take 1 tablet (25 mg total) by mouth every 8 (eight) hours as needed for nausea or vomiting. 20 tablet 0  . tamsulosin (FLOMAX) 0.4 MG CAPS capsule Take 0.4 mg by mouth daily.     No current facility-administered medications on file prior to visit.    Allergies  Allergen Reactions  . Amoxicillin Rash  . Augmentin [Amoxicillin-Pot Clavulanate] Rash  .  Elemental Sulfur Rash  . Sulfa Antibiotics Rash    Objective: There were no vitals filed for this visit.  General: No acute distress, AAOx3  Right foot: Sutures and staples intact with small amount of gapping distally at both incisions at surgical sites, mild friable skin with bloody drainage at distal incision areas, no warmth, no drainage, no other signs of infection noted, Capillary fill time <3 seconds in all digits, gross sensation present via light touch to right foot.  Guarding to right foot and  ankle.  Assessment and Plan:  Problem List Items Addressed This Visit   None   Visit Diagnoses    Cellulitis and abscess of foot, except toes    -  Primary   Relevant Orders   WOUND CULTURE   Arthralgia, unspecified joint       Relevant Orders   Uric acid   Sedimentation rate   C-reactive protein   Rheumatoid factor   ANA   [id number] Antigen   CBC with Differential/Platelet   S/P foot surgery, right       Relevant Medications   ibuprofen (ADVIL) 800 MG tablet   HYDROcodone-acetaminophen (NORCO) 10-325 MG tablet   Tendinitis       Capsulitis of ankle, right       Relevant Medications   ibuprofen (ADVIL) 800 MG tablet   HYDROcodone-acetaminophen (NORCO) 10-325 MG tablet   Chronic pain of right ankle       Relevant Medications   ibuprofen (ADVIL) 800 MG tablet   HYDROcodone-acetaminophen (NORCO) 10-325 MG tablet   Ankle instability, right       Bony exostosis            -Patient seen and evaluated -A few sutures were removed -Applied Dermabond and Steri-Strips and dry dressing to right foot and ankle -Advised patient to change bandage to using dry dressings as provided -Advised patient to continue with crutches and nonweightbearing -Advised patient to limit activity to necessity  -Advised patient to ice and elevate as instructed -Continue with hydrocodone and ibuprofen to use for pain -Continue with clindamycin until he has completed them because his incisions are still red and oozing I did obtain a wound culture and I will call patient if he needs to resume or change antibiotics -Also ordered arthritic panel for further evaluation since patient is slowly healing the skin incisions -Will plan for finishing suture removal at next office visit. In the meantime, patient to call office if any issues or problems arise.  -Advised continue with no work expected return on 4/18 light duty as previous.   Landis Martins, DPM

## 2020-09-25 ENCOUNTER — Other Ambulatory Visit: Payer: Self-pay | Admitting: Sports Medicine

## 2020-09-25 LAB — WOUND CULTURE

## 2020-09-25 MED ORDER — CIPROFLOXACIN HCL 500 MG PO TABS: 500.0000 mg | ORAL_TABLET | Freq: Two times a day (BID) | ORAL | 0 refills | Status: DC

## 2020-09-25 NOTE — Progress Notes (Signed)
Added Cipro for + wound culture

## 2020-09-27 ENCOUNTER — Other Ambulatory Visit: Payer: Self-pay

## 2020-09-27 ENCOUNTER — Encounter: Payer: Self-pay | Admitting: Allergy and Immunology

## 2020-09-27 ENCOUNTER — Telehealth: Payer: Self-pay

## 2020-09-27 ENCOUNTER — Ambulatory Visit (INDEPENDENT_AMBULATORY_CARE_PROVIDER_SITE_OTHER): Payer: Medicaid Other | Admitting: Allergy and Immunology

## 2020-09-27 VITALS — BP 132/88 | HR 96 | Resp 18 | Ht 75.0 in | Wt 218.6 lb

## 2020-09-27 DIAGNOSIS — U099 Post covid-19 condition, unspecified: Secondary | ICD-10-CM | POA: Diagnosis not present

## 2020-09-27 DIAGNOSIS — J984 Other disorders of lung: Secondary | ICD-10-CM

## 2020-09-27 DIAGNOSIS — J4530 Mild persistent asthma, uncomplicated: Secondary | ICD-10-CM | POA: Diagnosis not present

## 2020-09-27 DIAGNOSIS — T781XXD Other adverse food reactions, not elsewhere classified, subsequent encounter: Secondary | ICD-10-CM

## 2020-09-27 DIAGNOSIS — U071 COVID-19: Secondary | ICD-10-CM

## 2020-09-27 DIAGNOSIS — K200 Eosinophilic esophagitis: Secondary | ICD-10-CM

## 2020-09-27 NOTE — Telephone Encounter (Signed)
-----   Message from Landis Martins, [place] sent at 09/25/2020 11:17 AM EDT ----- Will you let patient know that I sent Cipro antibiotic for him to take as well to his pharmacy for + wound culture. Patient should also have clindamycin or be almost finished with this antibiotic Thanks Dr. Chauncey Cruel

## 2020-09-27 NOTE — Progress Notes (Signed)
[place] [facility] [place] [facility] [place] - [place]   Dear Dr. Delena Bali,  Thank you for referring Bob Roy to the [facility] on 09/27/2020.   Below is a summation of this patient's evaluation and recommendations.  Thank you for your referral. I will keep you informed about this patient's response to treatment.   If you have any questions please do not hesitate to contact me.   Sincerely,  Jiles Prows, MD Allergy / Immunology [facility]   ______________________________________________________________________    NEW PATIENT NOTE  Referring Provider: Nicoletta Dress, MD Primary Provider: Nicoletta Dress, MD Date of office visit: 09/27/2020    Subjective:   Chief Complaint:  Bob Roy (DOB: [birth year]) is a 20 y.o. male who presents to the clinic on 09/27/2020 with a chief complaint of Breathing Problem .     HPI: Bob Roy presents to this clinic in evaluation of 2 main issues.  First, he has a long history of asthma for which we have seen him in this clinic almost 8 years ago that basically became inactive for approximately 6 years until January 2022.  At that point in time he contracted Covid manifested as upper and lower respiratory tract symptoms, fever greater than 101, and constitutional symptoms which lasted about 7 days.  Since that event he has been left with wheezing and some shortness of breath and dyspnea on exertion.  He runs on a treadmill and he lifts weights and he has definitely been having some problems doing that with his current circumstance.  He was given a short acting bronchodilator which he is using twice a day every day.  In addition, he does have a history of allergic rhinitis that for the most part is really doing very well and he is not really bothered by this issue very much as he goes through the year.  Second, he has  eosinophilic esophagitis.  He has esophageal dysmotility with slow transit time and obstruction for which he underwent an upper endoscopy in the fall 2021 and was started on swallowed steroids and a proton pump inhibitor and recently has also had famotidine added into his regime.  He is not that much better.  He has definitely noticed some improvement but he still has obstructive events and he still has slow transit time and food still gets stuck in his chest.  Past Medical History:  Diagnosis Date   Anxiety    Arthritis    Asthma    Depression    Enlarged prostate    GERD (gastroesophageal reflux disease)    HTN (hypertension)    Obesity    Seasonal allergies     Past Surgical History:  Procedure Laterality Date   ANKLE SURGERY Right    CIRCUMCISION     KNEE ARTHROSCOPY Left    Radial fracture surgery Right    VENTRAL HERNIA REPAIR     WRIST ARTHROSCOPY Right     Allergies as of 09/27/2020      Reactions   Amoxicillin Rash   Augmentin [amoxicillin-pot Clavulanate] Rash   Elemental Sulfur Rash   Sulfa Antibiotics Rash      Medication List    celecoxib 200 MG capsule Commonly known as: CeleBREX Take 1 capsule (200 mg total) by mouth 2 (two) times daily.   cyclobenzaprine 5 MG tablet Commonly known as: FLEXERIL Take 5 mg by mouth 3 (three) times daily.  famotidine 20 MG tablet Commonly known as: PEPCID Take 1 tablet (20 mg total) by mouth daily.   fluticasone 220 MCG/ACT inhaler Commonly known as: FLOVENT HFA Inhale 2 puffs into the lungs 2 (two) times daily. Administer using metered dose inhaler without spacer x 8 weeks   pantoprazole 40 MG tablet Commonly known as: PROTONIX TAKE 1 TABLET BY MOUTH TWICE A DAY   ProAir HFA 108 (90 Base) MCG/ACT inhaler Generic drug: albuterol Inhale 2 puffs into the lungs every 4 (four) hours as needed for wheezing or shortness of breath.       Review of systems negative except as noted in HPI / PMHx or noted  below:  Review of Systems  Constitutional: Negative.   HENT: Negative.   Eyes: Negative.   Respiratory: Negative.   Cardiovascular: Negative.   Gastrointestinal: Negative.   Genitourinary: Negative.   Musculoskeletal: Negative.   Skin: Negative.   Neurological: Negative.   Endo/Heme/Allergies: Negative.   Psychiatric/Behavioral: Negative.     Family History  Problem Relation Age of Onset   Migraines Mother        Resolved in adulthood   Depression Mother    Anxiety disorder Mother    Breast cancer Mother        mets to liver   Clotting disorder Mother    Asthma Mother    Cancer Father    Diabetes Father    Irritable bowel syndrome Father    Breast cancer Maternal Grandmother    Ulcerative colitis Maternal Grandmother    Cancer Paternal Grandmother        type unknown   Prostate cancer Paternal Grandfather    Kidney disease Paternal Grandfather    Epilepsy Cousin    Colon cancer Neg Hx    Esophageal cancer Neg Hx    Stomach cancer Neg Hx    Rectal cancer Neg Hx     Social History   Socioeconomic History   Marital status: Single    Spouse name: Not on file   Number of children: 0   Years of education: Not on file   Highest education level: Not on file  Occupational History   Occupation: student  Tobacco Use   Smoking status: Former Smoker    Types: Cigarettes    Quit date: 2020    Years since quitting: 2.2   Smokeless tobacco: Former Systems developer    Types: Chew    Quit date: 2021  Vaping Use   Vaping Use: Never used  Substance and Sexual Activity   Alcohol use: Not Currently    Comment: occasional   Drug use: No   Sexual activity: Never    Birth control/protection: Abstinence  Other Topics Concern   Not on file  Social History Narrative   Not on file   Environmental and Social history  Lives in a apartment with a dry environment, dogs located inside the household, carpet in the bedroom, no plastic on the bed, no plastic  on the pillow, no smoking ongoing inside the household.  He is a Electronics engineer.   Objective:   Vitals:   09/27/20 0927  BP: 132/88  Pulse: 96  Resp: 18  SpO2: 98%   Height: 6\' 3"  (190.5 cm) Weight: 218 lb 9.6 oz (99.2 kg)  Physical Exam Constitutional:      Appearance: He is not diaphoretic.  HENT:     Head: Normocephalic.     Right Ear: Tympanic membrane, ear canal and external ear normal.  Left Ear: Tympanic membrane, ear canal and external ear normal.     Nose: Nose normal. No mucosal edema or rhinorrhea.     Mouth/Throat:     Pharynx: Uvula midline. No oropharyngeal exudate.  Eyes:     Conjunctiva/sclera: Conjunctivae normal.  Neck:     Thyroid: No thyromegaly.     Trachea: Trachea normal. No tracheal tenderness or tracheal deviation.  Cardiovascular:     Rate and Rhythm: Normal rate and regular rhythm.     Heart sounds: Normal heart sounds, S1 normal and S2 normal. No murmur heard.   Pulmonary:     Effort: No respiratory distress.     Breath sounds: Normal breath sounds. No stridor. No wheezing or rales.  Lymphadenopathy:     Head:     Right side of head: No tonsillar adenopathy.     Left side of head: No tonsillar adenopathy.     Cervical: No cervical adenopathy.  Skin:    Findings: No erythema or rash.     Nails: There is no clubbing.  Neurological:     Mental Status: He is alert.      Diagnostics: Allergy skin tests were not performed.   Spirometry was performed and demonstrated an FEV1 of 6.51 @ 122 % of predicted. FEV1/FVC = 0.86  Results of an esophageal biopsy obtained 12 April 2020 identified eosinophilic infiltration in the proximal esophagus with greater than 20 eosinophils per high-powered field.  Assessment and Plan:    1. COVID-19 with pulmonary comorbidity   2. Not well controlled mild persistent asthma   3. Adverse food reaction, subsequent encounter   4. Eosinophilic esophagitis     1.  Allergen avoidance measures??? Check  area 2 aeroallergen, milk components, egg components, wheat IgE, Nut panel w/R, CBC w/d  2.  Dairy free diet??? FFED???  3. Change Flovent to budesonide slurry - 0.5 gm ampule in 10 Splenda 2 times per day  4. Use Flovent for lungs: Flovent 220 - 2 inhalations 1 time per day with spacer starting with 'empty lungs'  5. If needed:   A. Albuterol HFA - 2 inhalations every 4-6 hours  B. Loratadine 10 mg - 1 tablet 1 time per day  6. Return to clinic in 4 weeks or earlier if problem  Briyan appears to have 2 main issues.  The first is that he is having some degree of inflammation of his airway post Covid infection with the background of childhood asthma.  We will have him use inhaled Flovent at 440 mcg daily for the next month and see what type of effect we received with this anti-inflammatory therapy.  The second issue is the fact that he has not responded to good therapy administered for his eosinophilic esophagitis including swallowed Flovent and aggressive reflux therapy.  We will now change him to budesonide slurry and investigate for aero allergen and food hypersensitivity with the blood test noted above.  If he still remains symptomatic in the face of this plan then he needs to undergo a restrictive diet whether that be a dairy free diet or possible for food elimination diet (FFED).  I encouraged him to have a repeat upper endoscopy with his gastroenterologist if he does not respond adequately to the therapy noted above.  I will regroup with him in 4 weeks or earlier if there is a problem.  Jiles Prows, MD Allergy / Immunology [facility]

## 2020-09-27 NOTE — Patient Instructions (Addendum)
  1.  Allergen avoidance measures??? Check area 2 aeroallergen, milk components, egg components, wheat IgE, Nut panel w/R, CBC w/d  2.  Dairy free diet??? FFED???  3. Change Flovent to budesonide slurry - 0.5 gm ampule in 10 Splenda 2 times per day  4. Use Flovent for lungs: Flovent 220 - 2 inhalations 1 time per day with spacer starting with 'empty lungs'  5. If needed:   A. Albuterol HFA - 2 inhalations every 4-6 hours  B. Loratadine 10 mg - 1 tablet 1 time per day  6. Return to clinic in 4 weeks or earlier if problem

## 2020-09-27 NOTE — Telephone Encounter (Signed)
Called patient twice and LVM stating that per Dr. Cannon Kettle Cipro was sent to his pharmacy for the + wound culture. Pt was also advised that he should have clindamycin or be almost done with this abx

## 2020-09-28 ENCOUNTER — Ambulatory Visit (INDEPENDENT_AMBULATORY_CARE_PROVIDER_SITE_OTHER): Payer: Medicaid Other | Admitting: Sports Medicine

## 2020-09-28 ENCOUNTER — Encounter: Payer: Self-pay | Admitting: Sports Medicine

## 2020-09-28 ENCOUNTER — Encounter: Payer: Self-pay | Admitting: Allergy and Immunology

## 2020-09-28 DIAGNOSIS — L02619 Cutaneous abscess of unspecified foot: Secondary | ICD-10-CM

## 2020-09-28 DIAGNOSIS — G8929 Other chronic pain: Secondary | ICD-10-CM

## 2020-09-28 DIAGNOSIS — M7751 Other enthesopathy of right foot: Secondary | ICD-10-CM

## 2020-09-28 DIAGNOSIS — M25571 Pain in right ankle and joints of right foot: Secondary | ICD-10-CM

## 2020-09-28 DIAGNOSIS — L03119 Cellulitis of unspecified part of limb: Secondary | ICD-10-CM

## 2020-09-28 DIAGNOSIS — M2550 Pain in unspecified joint: Secondary | ICD-10-CM

## 2020-09-28 DIAGNOSIS — M779 Enthesopathy, unspecified: Secondary | ICD-10-CM

## 2020-09-28 DIAGNOSIS — Z9889 Other specified postprocedural states: Secondary | ICD-10-CM

## 2020-09-28 LAB — ENA+DNA/DS+ANTICH+CENTRO+FA...
Anti JO-1: <0.2 AI (ref 0.0–0.9)
Centromere Ab Screen: <0.2 AI (ref 0.0–0.9)
Chromatin Ab SerPl-aCnc: <0.2 AI (ref 0.0–0.9)
ENA RNP Ab: 0.2 AI (ref 0.0–0.9)
ENA SM Ab Ser-aCnc: <0.2 AI (ref 0.0–0.9)
ENA SSA (RO) Ab: <0.2 AI (ref 0.0–0.9)
ENA SSB (LA) Ab: <0.2 AI (ref 0.0–0.9)
Nuclear Dot Pattern: 1:160 {titer} — ABNORMAL HIGH
Scleroderma (Scl-70) (ENA) Antibody, IgG: <0.2 AI (ref 0.0–0.9)
dsDNA Ab: 1 IU/mL (ref 0–9)

## 2020-09-28 LAB — CBC WITH DIFFERENTIAL/PLATELET
Basophils Absolute: 0 10*3/uL (ref 0.0–0.2)
Basos: 0 %
EOS (ABSOLUTE): 0.1 10*3/uL (ref 0.0–0.4)
Eos: 1 %
Hematocrit: 42.1 % (ref 37.5–51.0)
Hemoglobin: 14.3 g/dL (ref 13.0–17.7)
Immature Grans (Abs): 0 10*3/uL (ref 0.0–0.1)
Immature Granulocytes: 0 %
Lymphocytes Absolute: 1.5 10*3/uL (ref 0.7–3.1)
Lymphs: 16 %
MCH: 31.2 pg (ref 26.6–33.0)
MCHC: 34 g/dL (ref 31.5–35.7)
MCV: 92 fL (ref 79–97)
Monocytes Absolute: 0.4 10*3/uL (ref 0.1–0.9)
Monocytes: 5 %
Neutrophils Absolute: 6.9 10*3/uL (ref 1.4–7.0)
Neutrophils: 78 %
Platelets: 276 10*3/uL (ref 150–450)
RBC: 4.58 x10E6/uL (ref 4.14–5.80)
RDW: 13.1 % (ref 11.6–15.4)
WBC: 9 10*3/uL (ref 3.4–10.8)

## 2020-09-28 LAB — URIC ACID: Uric Acid: 5.2 mg/dL (ref 3.8–8.4)

## 2020-09-28 LAB — RHEUMATOID FACTOR: Rheumatoid fact SerPl-aCnc: <10 IU/mL (ref <14.0)

## 2020-09-28 LAB — HLA-B27 ANTIGEN: HLA B27: NEGATIVE

## 2020-09-28 LAB — SEDIMENTATION RATE: Sed Rate: 12 mm/hr (ref 0–15)

## 2020-09-28 LAB — C-REACTIVE PROTEIN: CRP: 3 mg/L (ref 0–10)

## 2020-09-28 LAB — ANA: ANA Titer 1: POSITIVE — AB

## 2020-09-28 MED ORDER — ACETAMINOPHEN-CODEINE #3 300-30 MG PO TABS
1.0000 | ORAL_TABLET | Freq: Four times a day (QID) | ORAL | 0 refills | Status: DC | PRN
Start: 2020-09-28 — End: 2020-10-08

## 2020-09-28 MED ORDER — BUDESONIDE 0.5 MG/2ML IN SUSP: RESPIRATORY_TRACT | 5 refills | Status: DC

## 2020-09-28 MED ORDER — SPACER/AERO-HOLDING CHAMBERS DEVI: 0 refills | Status: DC

## 2020-09-28 NOTE — Progress Notes (Signed)
Subjective: Bob Roy is a 20 y.o. male patient seen today in office for POV #5 (DOS 08/31/2020), S/P right ankle arthrotomy and tarsal exostectomy with application of splint. Patient admits so pain and thinks that the new antibiotics are helping. Denies calf pain, denies headache, chest pain, shortness of breath, nausea, vomiting, fever, or chills. No other issues noted.   Patient Active Problem List   Diagnosis Date Noted  . Contusion of right hand 05/05/2020  . Right wrist pain 09/19/2019  . Lumbar back pain 09/10/2019  . 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 Traumatic tear of triangular fibrocartilage complex of right wrist 07/10/2019  . Umbilical pain 35/36/1443  . Postoperative examination 02/20/2018  . Ventral hernia without obstruction or gangrene 02/06/2018  . Myofascial pain 04/25/2016  . Asthma 03/23/2015  . AR (allergic rhinitis) 03/23/2015  . 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 Other secondary hypertension 01/14/2015  . Postconcussion syndrome 01/14/2015  . Migraine without aura and without status migrainosus, not intractable 01/14/2015  . Chronic daily headache 01/14/2015  . Medication overuse headache 01/14/2015  . 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 Left anterior knee pain 12/16/2014    Current Outpatient Medications on File Prior to Visit  Medication Sig Dispense Refill  . albuterol (PROAIR HFA) 108 (90 Base) MCG/ACT inhaler Inhale 2 puffs into the lungs every 4 (four) hours as needed for wheezing or shortness of breath.    . budesonide (PULMICORT) 0.5 MG/2ML nebulizer solution 0.5 gm ampule mixed in 10 Splenda packs 2 times per day 120 mL 5  . celecoxib (CELEBREX) 200 MG capsule Take 1 capsule (200 mg total) by mouth 2 (two) times daily. 30 capsule 1  . cyclobenzaprine (FLEXERIL) 5 MG tablet Take 5 mg by mouth 3 (three) times daily.    .  famotidine (PEPCID) 20 MG tablet Take 1 tablet (20 mg total) by mouth daily. 90 tablet 3  . fluticasone (FLOVENT HFA) 220 MCG/ACT inhaler Inhale 2 puffs into the lungs 2 (two) times daily. Administer using metered dose inhaler without spacer x 8 weeks 1 each 12  . pantoprazole (PROTONIX) 40 MG tablet TAKE 1 TABLET BY MOUTH TWICE A DAY 180 tablet 1  . Spacer/Aero-Holding Dorise Bullion Use with inhaler 1 each 0   No current facility-administered medications on file prior to visit.    Allergies  Allergen Reactions  . Amoxicillin Rash  . Augmentin [Amoxicillin-Pot Clavulanate] Rash  . Elemental Sulfur Rash  . Sulfa Antibiotics Rash    Objective: There were no vitals filed for this visit.  General: No acute distress, AAOx3  Right foot: Sutures and staples intact with minimal oozing and localized redness that appears to be much better than last visit, no other signs of infection noted, Capillary fill time <3 seconds in all digits, gross sensation present via light touch to right foot.  Guarding to right foot and ankle.  Assessment and Plan:  Problem List Items Addressed This Visit   None   Visit Diagnoses    Chronic pain of right ankle    -  Primary   Capsulitis of ankle, right       Tendinitis       Cellulitis and abscess of foot, except toes       S/P foot surgery, right       Arthralgia, unspecified joint         -Patient seen and evaluated -A remaining sutures  and staples were removed -Applied steri strips and dry dressing and advised patient to apply dry dressing weekly   -Awaiting results of arthritic panel for further evaluation since patient is slowly healing the skin incisions -Dispensed cam boot patient may start to weight-bear with this boot starting next week and may slowly discontinue using crutches -Continue with Cipro -Prescribed Tylenol 3 for pain -Will plan for post op check at next visit -Advised continue with no work expected return on 4/18 light duty as of  now.   Landis Martins, DPM

## 2020-09-28 NOTE — Addendum Note (Signed)
Addended by: Guy Franco on: 09/28/2020 09:11 AM   Modules accepted: Orders

## 2020-09-28 NOTE — Progress Notes (Signed)
Note sent to [facility] admin pool for OV to be scheduled.

## 2020-09-29 ENCOUNTER — Telehealth: Payer: Self-pay

## 2020-09-29 ENCOUNTER — Other Ambulatory Visit: Payer: Self-pay | Admitting: Sports Medicine

## 2020-09-29 DIAGNOSIS — M2550 Pain in unspecified joint: Secondary | ICD-10-CM

## 2020-09-29 DIAGNOSIS — R768 Other specified abnormal immunological findings in serum: Secondary | ICD-10-CM

## 2020-09-29 NOTE — Telephone Encounter (Signed)
OK to schedule EGD. I have availability this week.

## 2020-09-29 NOTE — Telephone Encounter (Signed)
Pt was advised of his + blood work of ANA inflammatory. Pt was advised that per Dr. Cannon Kettle he should F/U with PCP or we can refer him to rheumatologist. Pt states he would prefer to get a refer to rhemuatologist

## 2020-09-29 NOTE — Telephone Encounter (Signed)
Mr Bob Roy would like to schedule an Endoscopy because he is having dificulty swallowing. Tells me he saw Dr Neldon Mc on Monday 09-27-20 and that physician also feels that his esophagus needs to be stretched per pt. The next available office visit was May 5 and patient felt that appointment was not soon enough.

## 2020-09-29 NOTE — Telephone Encounter (Signed)
Lm on vm for patient to return call, advised that he can schedule EGD.   When patient returns call please schedule for direct EGD with Dr. Tarri Glenn, she has availability this week. Nothing to eat or drink after midnight the night before.

## 2020-09-29 NOTE — Telephone Encounter (Signed)
Referral placed for [facility] rheumatology patient should give them 1 to 2 weeks to contact him to schedule an appointment.  If he has not heard anything in 2 weeks please let our office know so we can follow-up this referral. Thanks Dr. Cannon Kettle

## 2020-09-29 NOTE — Progress Notes (Signed)
Referral placed for [facility] rheumatology due to positive blood work.

## 2020-09-29 NOTE — Telephone Encounter (Signed)
Patient is scheduled for EGD 10/01/20 @ 2:30pm

## 2020-09-29 NOTE — Telephone Encounter (Signed)
LVM for patient to schedule an office visit with Dr Tarri Glenn followup regarding EOE

## 2020-09-29 NOTE — Telephone Encounter (Signed)
-----   Message from Landis Martins, [place] sent at 09/29/2020  8:13 AM EDT ----- Will you let patient know that his ANA inflammatory marker came back + on his blood work. We recommend patient to follow up with PCP or we can refer him to a rheumatologist for further evaluation to figure out what type of inflammatory arthritis he has. -Dr. Chauncey Cruel

## 2020-09-30 NOTE — Telephone Encounter (Signed)
Pt was advised that referral has been placed for [facility]. Pt was told to give them 1-2 wks for them to contact him to schedule an appointment, and if he has not heard anything to contact our office so we can f/u. Pt stated understanding

## 2020-10-01 ENCOUNTER — Encounter: Payer: Self-pay | Admitting: Gastroenterology

## 2020-10-01 ENCOUNTER — Ambulatory Visit (AMBULATORY_SURGERY_CENTER): Payer: Medicaid Other | Admitting: Gastroenterology

## 2020-10-01 ENCOUNTER — Other Ambulatory Visit: Payer: Self-pay

## 2020-10-01 ENCOUNTER — Telehealth: Payer: Self-pay | Admitting: Sports Medicine

## 2020-10-01 VITALS — BP 122/80 | HR 78 | Temp 98.5°F | Resp 19 | Ht 75.0 in | Wt 218.0 lb

## 2020-10-01 DIAGNOSIS — K2970 Gastritis, unspecified, without bleeding: Secondary | ICD-10-CM

## 2020-10-01 DIAGNOSIS — K319 Disease of stomach and duodenum, unspecified: Secondary | ICD-10-CM | POA: Diagnosis not present

## 2020-10-01 DIAGNOSIS — K2980 Duodenitis without bleeding: Secondary | ICD-10-CM

## 2020-10-01 DIAGNOSIS — K2960 Other gastritis without bleeding: Secondary | ICD-10-CM

## 2020-10-01 DIAGNOSIS — R1310 Dysphagia, unspecified: Secondary | ICD-10-CM | POA: Diagnosis not present

## 2020-10-01 MED ORDER — SODIUM CHLORIDE 0.9 % IV SOLN
500.0000 mL | Freq: Once | INTRAVENOUS | Status: DC
Start: 2020-10-01 — End: 2020-10-01

## 2020-10-01 MED ORDER — SPACER/AERO-HOLDING CHAMBERS DEVI: 1 refills | Status: AC

## 2020-10-01 MED ORDER — LORATADINE 10 MG PO TABS: ORAL_TABLET | ORAL | 5 refills | Status: DC

## 2020-10-01 MED ORDER — PULMICORT 0.5 MG/2ML IN SUSP: RESPIRATORY_TRACT | 5 refills | Status: DC

## 2020-10-01 NOTE — Telephone Encounter (Signed)
The following patient was told today that he needed to come off all NSAIDs and Celebrex, they are causing the patient to develop ulcers and other health issues as well, Patient has requested something else is prescribed to help tolerate pain, Please Advise  Patient has requested return call if possible [phone]

## 2020-10-01 NOTE — Progress Notes (Signed)
PT taken to PACU. Monitors in place. VSS. Report given to RN. 

## 2020-10-01 NOTE — Progress Notes (Signed)
Called to room to assist during endoscopic procedure.  Patient ID and intended procedure confirmed with present staff. Received instructions for my participation in the procedure from the performing physician.  

## 2020-10-01 NOTE — Op Note (Signed)
[facility] Patient Name: Bob Roy Procedure Date: 10/01/2020 2:29 PM MRN: [id number] Endoscopist: Thornton Park MD, MD Age: 20 Referring MD:  Date of Birth: 26-Dec-2000 Gender: Male Account #: [id number] Procedure:                Upper GI endoscopy Indications:              Dysphagia, Follow-up of eosinophilic esophagitis                           Ongoing symptoms despite maximum medical therapy Medicines:                Monitored Anesthesia Care Procedure:                Pre-Anesthesia Assessment:                           - Prior to the procedure, a History and Physical                            was performed, and patient medications and                            allergies were reviewed. The patient's tolerance of                            previous anesthesia was also reviewed. The risks                            and benefits of the procedure and the sedation                            options and risks were discussed with the patient.                            All questions were answered, and informed consent                            was obtained. Prior Anticoagulants: The patient has                            taken no previous anticoagulant or antiplatelet                            agents. ASA Grade Assessment: II - A patient with                            mild systemic disease. After reviewing the risks                            and benefits, the patient was deemed in                            satisfactory condition to undergo the procedure.  After obtaining informed consent, the endoscope was                            passed under direct vision. Throughout the                            procedure, the patient's blood pressure, pulse, and                            oxygen saturations were monitored continuously. The                            Endoscope was introduced through the mouth, and                             advanced to the third part of duodenum. The upper                            GI endoscopy was accomplished without difficulty.                            The patient tolerated the procedure well. Scope In: Scope Out: Findings:                 The examined esophagus was normal except for a mild                            ringed appearance in the mid esophagus. No obvious                            stricture Biopsies were obtained from the proximal                            and distal esophagus with cold forceps for                            histology of suspected eosinophilic esophagitis.                           The Z-line was regular and was found 39 cm from the                            incisors.                           Multiple small erosions with no bleeding and no                            stigmata of recent bleeding were found in the                            gastric antrum. Biopsies were taken from antrum,  body, and fundus with a cold forceps for histology.                            Estimated blood loss was minimal.                           Diffuse moderately to severe erythematous mucosa                            without active bleeding and with no stigmata of                            bleeding was found in the duodenal bulb, in the                            first portion of the duodenum and in the second                            portion of the duodenum. Biopsies were taken from                            with a cold forceps for histology. Estimated blood                            loss was minimal.                           The cardia and gastric fundus were normal on                            retroflexion. Complications:            No immediate complications. Estimated blood loss:                            Minimal. Estimated Blood Loss:     Estimated blood loss was minimal. Impression:               - Normal esophagus. Biopsied.                            - Z-line regular, 39 cm from the incisors.                           - Erosive gastropathy with no bleeding and no                            stigmata of recent bleeding. This is a new findings                            compared to the last EGD Biopsied.                           - Severe erythematous duodenopathy. This is a new  findings compared to the last EGD. Biopsied.                           - Changes may be related to Celebrex and/or other                            NSAIDs. Recommendation:           - Patient has a contact number available for                            emergencies. The signs and symptoms of potential                            delayed complications were discussed with the                            patient. Return to normal activities tomorrow.                            Written discharge instructions were provided to the                            patient.                           - Resume previous diet.                           - Continue present medications.                           - Avoid all NSAIDs.                           - Await pathology results.                           - Follow-up with Dr. Tarri Glenn to review these                            results. Thornton Park MD, MD 10/01/2020 3:02:21 PM This report has been signed electronically.

## 2020-10-01 NOTE — Progress Notes (Signed)
Medical history reviewed with no changes noted. VS assessed by C.W 

## 2020-10-01 NOTE — Telephone Encounter (Signed)
Will you let the patient know he can continue taking Tylenol 3 for his pain.  That is not an NSAID. -Dr. Cannon Kettle

## 2020-10-01 NOTE — Progress Notes (Addendum)
Pt disregarded the instructions that was given to him. He climbed out of the bed over the side rail and tore off his monitors. He was cussing and rude while in recovery while his wife laughed at everything. Tried to provide d/c instructions while he was cussing at me. He called his pharmacy and told them his doctor took him off all his medications.  Encouraged him to stay in bed until I assessed that he was ok to stand. He did not listen. Pt d/c's will all written instructions and taken to car via wheelchair.

## 2020-10-01 NOTE — Patient Instructions (Signed)
Resume previous diet Continue current medications Avoid all NSAID's  Await pathology results YOU HAD AN ENDOSCOPIC PROCEDURE TODAY AT [facility]:   Refer to the procedure report that was given to you for any specific questions about what was found during the examination.  If the procedure report does not answer your questions, please call your gastroenterologist to clarify.  If you requested that your care partner not be given the details of your procedure findings, then the procedure report has been included in a sealed envelope for you to review at your convenience later.  YOU SHOULD EXPECT: Some feelings of bloating in the abdomen. Passage of more gas than usual.  Walking can help get rid of the air that was put into your GI tract during the procedure and reduce the bloating. If you had a lower endoscopy (such as a colonoscopy or flexible sigmoidoscopy) you may notice spotting of blood in your stool or on the toilet paper. If you underwent a bowel prep for your procedure, you may not have a normal bowel movement for a few days.  Please Note:  You might notice some irritation and congestion in your nose or some drainage.  This is from the oxygen used during your procedure.  There is no need for concern and it should clear up in a day or so.  SYMPTOMS TO REPORT IMMEDIATELY:   Following upper endoscopy (EGD)  Vomiting of blood or coffee ground material  New chest pain or pain under the shoulder blades  Painful or persistently difficult swallowing  New shortness of breath  Fever of 100F or higher  Black, tarry-looking stools  For urgent or emergent issues, a gastroenterologist can be reached at any hour by calling [phone]. Do not use MyChart messaging for urgent concerns.   DIET:  We do recommend a small meal at first, but then you may proceed to your regular diet.  Drink plenty of fluids but you should avoid alcoholic beverages for 24 hours.  ACTIVITY:  You should  plan to take it easy for the rest of today and you should NOT DRIVE or use heavy machinery until tomorrow (because of the sedation medicines used during the test).    FOLLOW UP: Our staff will call the number listed on your records 48-72 hours following your procedure to check on you and address any questions or concerns that you may have regarding the information given to you following your procedure. If we do not reach you, we will leave a message.  We will attempt to reach you two times.  During this call, we will ask if you have developed any symptoms of COVID 19. If you develop any symptoms (ie: fever, flu-like symptoms, shortness of breath, cough etc.) before then, please call [phone].  If you test positive for Covid 19 in the 2 weeks post procedure, please call and report this information to [place].    If any biopsies were taken you will be contacted by phone or by letter within the next 1-3 weeks.  Please call us at [phone] if you have not heard about the biopsies in 3 weeks.   SIGNATURES/CONFIDENTIALITY: You and/or your care partner have signed paperwork which will be entered into your electronic medical record.  These signatures attest to the fact that that the information above on your After Visit Summary has been reviewed and is understood.  Full responsibility of the confidentiality of this discharge information lies with you and/or your care-partner.

## 2020-10-02 LAB — IGE NUT PROF. W/COMPONENT RFLX

## 2020-10-03 LAB — PEANUT COMPONENTS
F352-IgE Ara h 8: 1.61 kU/L — AB
F422-IgE Ara h 1: <0.1 kU/L
F423-IgE Ara h 2: <0.1 kU/L
F424-IgE Ara h 3: 0.17 kU/L — AB
F427-IgE Ara h 9: 3.49 kU/L — AB
F447-IgE Ara h 6: <0.1 kU/L

## 2020-10-03 LAB — CBC WITH DIFFERENTIAL/PLATELET
Basophils Absolute: 0.1 10*3/uL (ref 0.0–0.2)
Basos: 2 %
EOS (ABSOLUTE): 0.7 10*3/uL — ABNORMAL HIGH (ref 0.0–0.4)
Eos: 10 %
Hematocrit: 40.8 % (ref 37.5–51.0)
Hemoglobin: 13.7 g/dL (ref 13.0–17.7)
Immature Grans (Abs): 0 10*3/uL (ref 0.0–0.1)
Immature Granulocytes: 1 %
Lymphocytes Absolute: 1.6 10*3/uL (ref 0.7–3.1)
Lymphs: 23 %
MCH: 31 pg (ref 26.6–33.0)
MCHC: 33.6 g/dL (ref 31.5–35.7)
MCV: 92 fL (ref 79–97)
Monocytes Absolute: 0.4 10*3/uL (ref 0.1–0.9)
Monocytes: 6 %
Neutrophils Absolute: 4.1 10*3/uL (ref 1.4–7.0)
Neutrophils: 58 %
Platelets: 311 10*3/uL (ref 150–450)
RBC: 4.42 x10E6/uL (ref 4.14–5.80)
RDW: 13.1 % (ref 11.6–15.4)
WBC: 6.9 10*3/uL (ref 3.4–10.8)

## 2020-10-03 LAB — ALLERGENS W/TOTAL IGE AREA 2
Alternaria Alternata IgE: 19.2 kU/L — AB
Aspergillus Fumigatus IgE: 0.91 kU/L — AB
Bermuda Grass IgE: 11.5 kU/L — AB
Cat Dander IgE: 1.2 kU/L — AB
Cedar, Mountain IgE: 3.29 kU/L — AB
Cladosporium Herbarum IgE: 0.32 kU/L — AB
Cockroach, German IgE: 0.49 kU/L — AB
Common Silver Birch IgE: 2.36 kU/L — AB
Cottonwood IgE: 1.4 kU/L — AB
D Farinae IgE: 0.21 kU/L — AB
D Pteronyssinus IgE: 0.16 kU/L — AB
Dog Dander IgE: 0.74 kU/L — AB
Elm, American IgE: 4.08 kU/L — AB
IgE (Immunoglobulin E), Serum: 1896 IU/mL — ABNORMAL HIGH (ref 6–495)
Johnson Grass IgE: 14.4 kU/L — AB
Maple/Box Elder IgE: 1.5 kU/L — AB
Mouse Urine IgE: <0.1 kU/L
Oak, White IgE: 2.55 kU/L — AB
Pecan, Hickory IgE: 4 kU/L — AB
Penicillium Chrysogen IgE: <0.1 kU/L
Pigweed, Rough IgE: 1.7 kU/L — AB
Ragweed, Short IgE: 1.95 kU/L — AB
Sheep Sorrel IgE Qn: 1.84 kU/L — AB
Timothy Grass IgE: 61.3 kU/L — AB
White Mulberry IgE: 0.3 kU/L — AB

## 2020-10-03 LAB — PANEL 604726
Cor A 1 IgE: 1.55 kU/L — AB
Cor A 14 IgE: <0.1 kU/L
Cor A 8 IgE: 1.93 kU/L — AB
Cor A 9 IgE: <0.1 kU/L

## 2020-10-03 LAB — IGE NUT PROF. W/COMPONENT RFLX
F017-IgE Hazelnut (Filbert): 5.79 kU/L — AB
F018-IgE Brazil Nut: 0.6 kU/L — AB
F020-IgE Almond: 2.08 kU/L — AB
F202-IgE Cashew Nut: 0.22 kU/L — AB
F203-IgE Pistachio Nut: 1.29 kU/L — AB
F256-IgE Walnut: 4.72 kU/L — AB
Macadamia Nut, IgE: 1.27 kU/L — AB
Peanut, IgE: 4.88 kU/L — AB
Pecan Nut IgE: 0.1 kU/L — AB

## 2020-10-03 LAB — ALLERGEN, WHEAT, F4: Wheat IgE: 11.5 kU/L — AB

## 2020-10-03 LAB — EGG COMPONENT PANEL
F232-IgE Ovalbumin: 0.49 kU/L — AB
F233-IgE Ovomucoid: 0.31 kU/L — AB

## 2020-10-03 LAB — PANEL 604721
Jug R 1 IgE: <0.1 kU/L
Jug R 3 IgE: 1.97 kU/L — AB

## 2020-10-03 LAB — MILK COMPONENT PANEL
F076-IgE Alpha Lactalbumin: 0.14 kU/L — AB
F077-IgE Beta Lactoglobulin: 0.13 kU/L — AB
F078-IgE Casein: 0.14 kU/L — AB

## 2020-10-03 LAB — ALLERGEN COMPONENT COMMENTS

## 2020-10-03 LAB — PANEL 604239: ANA O 3 IgE: <0.1 kU/L

## 2020-10-03 LAB — PANEL 604350: Ber E 1 IgE: <0.1 kU/L

## 2020-10-04 ENCOUNTER — Encounter: Payer: Self-pay | Admitting: Gastroenterology

## 2020-10-05 ENCOUNTER — Telehealth: Payer: Self-pay

## 2020-10-05 NOTE — Telephone Encounter (Signed)
  Follow up Call-  Call back number 10/01/2020 04/08/2020  Post procedure Call Back phone  # [phone]  Permission to leave phone message Yes Yes  Some recent data might be hidden     Patient questions:  Do you have a fever, pain , or abdominal swelling? No. Pain Score  0 *  Have you tolerated food without any problems? Yes.    Have you been able to return to your normal activities? Yes.    Do you have any questions about your discharge instructions: Diet   No. Medications  No. Follow up visit  No.  Do you have questions or concerns about your Care? No.  Actions: * If pain score is 4 or above: No action needed, pain <4.  1. Have you developed a fever since your procedure? no  2.   Have you had an respiratory symptoms (SOB or cough) since your procedure? no  3.   Have you tested positive for COVID 19 since your procedure no  4.   Have you had any family members/close contacts diagnosed with the COVID 19 since your procedure?  no   If yes to any of these questions please route to Joylene John, RN and Joella Prince, RN

## 2020-10-05 NOTE — Telephone Encounter (Signed)
No answer, left message to call back later today, B.Keyron Pokorski RN. 

## 2020-10-06 NOTE — Telephone Encounter (Signed)
LVM to patient relaying Dr. Leeanne Rio message that it's fine for him to take tylenol 3 for pain

## 2020-10-07 ENCOUNTER — Other Ambulatory Visit: Payer: Self-pay | Admitting: Sports Medicine

## 2020-10-08 ENCOUNTER — Other Ambulatory Visit: Payer: Self-pay | Admitting: Sports Medicine

## 2020-10-08 DIAGNOSIS — Z9889 Other specified postprocedural states: Secondary | ICD-10-CM

## 2020-10-08 NOTE — Telephone Encounter (Signed)
Please advise 

## 2020-10-12 ENCOUNTER — Encounter: Payer: Self-pay | Admitting: Gastroenterology

## 2020-10-12 ENCOUNTER — Other Ambulatory Visit: Payer: Self-pay

## 2020-10-12 ENCOUNTER — Encounter: Payer: Self-pay | Admitting: Sports Medicine

## 2020-10-12 ENCOUNTER — Ambulatory Visit (INDEPENDENT_AMBULATORY_CARE_PROVIDER_SITE_OTHER): Payer: Medicaid Other | Admitting: Sports Medicine

## 2020-10-12 DIAGNOSIS — R768 Other specified abnormal immunological findings in serum: Secondary | ICD-10-CM

## 2020-10-12 DIAGNOSIS — M25571 Pain in right ankle and joints of right foot: Secondary | ICD-10-CM

## 2020-10-12 DIAGNOSIS — M2550 Pain in unspecified joint: Secondary | ICD-10-CM

## 2020-10-12 DIAGNOSIS — G8929 Other chronic pain: Secondary | ICD-10-CM

## 2020-10-12 DIAGNOSIS — M7751 Other enthesopathy of right foot: Secondary | ICD-10-CM

## 2020-10-12 MED ORDER — ACETAMINOPHEN-CODEINE 300-30 MG PO TABS: 1.0000 | ORAL_TABLET | Freq: Three times a day (TID) | ORAL | 0 refills | Status: DC

## 2020-10-12 NOTE — Progress Notes (Signed)
Subjective: Bob Roy is a 20 y.o. male patient seen today in office for POV #6 (DOS 08/31/2020), S/P right ankle arthrotomy and tarsal exostectomy with application of splint. Patient admits to a little bit of pain but feels like he is doing better now walking with his cam boot without his crutches, denies calf pain, denies headache, chest pain, shortness of breath, nausea, vomiting, fever, or chills but does state that his GI doctor said that he had to stop all NSAIDs due to risk of ulcers. No other issues noted.   Patient Active Problem List   Diagnosis Date Noted  . Contusion of right hand 05/05/2020  . Right wrist pain 09/19/2019  . Lumbar back pain 09/10/2019  . 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 Traumatic tear of triangular fibrocartilage complex of right wrist 07/10/2019  . Umbilical pain 16/04/9603  . Postoperative examination 02/20/2018  . Ventral hernia without obstruction or gangrene 02/06/2018  . Myofascial pain 04/25/2016  . Asthma 03/23/2015  . AR (allergic rhinitis) 03/23/2015  . 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 Other secondary hypertension 01/14/2015  . Postconcussion syndrome 01/14/2015  . Migraine without aura and without status migrainosus, not intractable 01/14/2015  . Chronic daily headache 01/14/2015  . Medication overuse headache 01/14/2015  . 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 Left anterior knee pain 12/16/2014    Current Outpatient Medications on File Prior to Visit  Medication Sig Dispense Refill  . Acetaminophen-Codeine 300-30 MG tablet TAKE 1 TABLET BY MOUTH EVERY 6 (SIX) HOURS AS NEEDED FOR UP TO 7 DAYS FOR MODERATE PAIN. 20 tablet 1  . albuterol (VENTOLIN HFA) 108 (90 Base) MCG/ACT inhaler Inhale 2 puffs into the lungs every 4 (four) hours as needed for wheezing or shortness of breath.    . celecoxib (CELEBREX) 200 MG capsule  Take 1 capsule (200 mg total) by mouth 2 (two) times daily. 30 capsule 1  . ciprofloxacin (CIPRO) 250 MG tablet Take 500 mg by mouth 2 (two) times daily.    . cyclobenzaprine (FLEXERIL) 5 MG tablet Take 5 mg by mouth 3 (three) times daily.    . famotidine (PEPCID) 20 MG tablet Take 1 tablet (20 mg total) by mouth daily. 90 tablet 3  . fluticasone (FLOVENT HFA) 220 MCG/ACT inhaler Inhale 2 puffs into the lungs 2 (two) times daily. Administer using metered dose inhaler without spacer x 8 weeks 1 each 12  . ibuprofen (ADVIL) 800 MG tablet TAKE 1 TABLET BY MOUTH EVERY 8 HOURS AS NEEDED 30 tablet 0  . loratadine (CLARITIN) 10 MG tablet Can take one tablet by mouth once daily if needed. (Patient not taking: Reported on 10/01/2020) 30 tablet 5  . pantoprazole (PROTONIX) 40 MG tablet TAKE 1 TABLET BY MOUTH TWICE A DAY 180 tablet 1  . PULMICORT 0.5 MG/2ML nebulizer solution Mix one ampule with 10 Splenda packets and swallow twice daily as directed. 120 mL 5  . Spacer/Aero-Holding Dorise Bullion Use with inhaler as directed. 1 each 1   No current facility-administered medications on file prior to visit.    Allergies  Allergen Reactions  . Amoxicillin Rash  . Augmentin [Amoxicillin-Pot Clavulanate] Rash  . Elemental Sulfur Rash  . Sulfa Antibiotics Rash    Objective: There were no vitals filed for this visit.  General: No acute distress, AAOx3  Right foot: Incisions appear to be well-healed, Steri-Strips removed, no other signs of infection noted, Capillary fill  time <3 seconds in all digits, gross sensation present via light touch to right foot.  Guarding to right foot and ankle still with light touch however patient does state that pain is doing better.  Assessment and Plan:  Problem List Items Addressed This Visit   None   Visit Diagnoses    Arthralgia, unspecified joint    -  Primary   ANA positive       Chronic pain of right ankle       Capsulitis of ankle, right         -Patient seen  and evaluated -Applied Ace wrap and advised patient to continue to wear to assist with pain and swelling -Patient to see rheumatology on 5/4 due to positive ANA -Continue with weightbearing with cam boot and over the weekend may try to wean from the boot to a normal shoe if he is doing better then he can return to work on 4/18 however if he still has pain he must stay out of work until I recheck him back -Refilled Tylenol 3 for pain -Will plan for post op check and x-rays at next visit   Landis Martins, DPM

## 2020-10-18 ENCOUNTER — Other Ambulatory Visit: Payer: Self-pay

## 2020-10-18 MED ORDER — EPINEPHRINE 0.3 MG/0.3ML IJ SOAJ: INTRAMUSCULAR | 3 refills | Status: DC

## 2020-10-25 ENCOUNTER — Ambulatory Visit: Payer: Medicaid Other | Admitting: Allergy and Immunology

## 2020-10-26 ENCOUNTER — Other Ambulatory Visit: Payer: Self-pay | Admitting: Sports Medicine

## 2020-10-27 ENCOUNTER — Ambulatory Visit (INDEPENDENT_AMBULATORY_CARE_PROVIDER_SITE_OTHER): Payer: Medicaid Other

## 2020-10-27 ENCOUNTER — Encounter: Payer: Self-pay | Admitting: Sports Medicine

## 2020-10-27 ENCOUNTER — Telehealth: Payer: Self-pay

## 2020-10-27 ENCOUNTER — Ambulatory Visit (INDEPENDENT_AMBULATORY_CARE_PROVIDER_SITE_OTHER): Payer: Medicaid Other | Admitting: Sports Medicine

## 2020-10-27 ENCOUNTER — Other Ambulatory Visit: Payer: Self-pay | Admitting: Sports Medicine

## 2020-10-27 ENCOUNTER — Other Ambulatory Visit: Payer: Self-pay

## 2020-10-27 DIAGNOSIS — M2550 Pain in unspecified joint: Secondary | ICD-10-CM | POA: Diagnosis not present

## 2020-10-27 DIAGNOSIS — M898X9 Other specified disorders of bone, unspecified site: Secondary | ICD-10-CM

## 2020-10-27 DIAGNOSIS — M19071 Primary osteoarthritis, right ankle and foot: Secondary | ICD-10-CM

## 2020-10-27 DIAGNOSIS — R768 Other specified abnormal immunological findings in serum: Secondary | ICD-10-CM

## 2020-10-27 DIAGNOSIS — M25371 Other instability, right ankle: Secondary | ICD-10-CM

## 2020-10-27 DIAGNOSIS — Z9889 Other specified postprocedural states: Secondary | ICD-10-CM

## 2020-10-27 DIAGNOSIS — M779 Enthesopathy, unspecified: Secondary | ICD-10-CM

## 2020-10-27 DIAGNOSIS — M7751 Other enthesopathy of right foot: Secondary | ICD-10-CM

## 2020-10-27 NOTE — Telephone Encounter (Signed)
Patient called requesting if he could have a RF of his Celebrex sent to [facility]-([address]) for a longer period of time, so he won't have to go to the pharmacy too often. Please advice

## 2020-10-27 NOTE — Telephone Encounter (Signed)
Pt was informed of Dr. Leeanne Rio instructions at his appointment visit today 10/27/20

## 2020-10-27 NOTE — Progress Notes (Signed)
I will refill Celebrex for the 30 days once he goes to rheumatology next week they may change him to something else

## 2020-10-27 NOTE — Telephone Encounter (Signed)
Please advise 

## 2020-10-27 NOTE — Progress Notes (Signed)
Subjective: Bob Roy is a 20 y.o. male patient seen today in office for POV 7 (DOS 08/31/2020), S/P right ankle arthrotomy and tarsal exostectomy with application of splint. Reports that feels a little better but did have a fall twice while not wearing the boot. Patient reports that he is still having pain on his left ankle and is wondering what we should do about that. Patient reports mobic helped him a lot in the past, more than the celebrex. No other issues noted.   Patient Active Problem List   Diagnosis Date Noted  . Contusion of right hand 05/05/2020  . Right wrist pain 09/19/2019  . Lumbar back pain 09/10/2019  . 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 Traumatic tear of triangular fibrocartilage complex of right wrist 07/10/2019  . Umbilical pain 35/32/9924  . Postoperative examination 02/20/2018  . Ventral hernia without obstruction or gangrene 02/06/2018  . Myofascial pain 04/25/2016  . Asthma 03/23/2015  . AR (allergic rhinitis) 03/23/2015  . 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 Other secondary hypertension 01/14/2015  . Postconcussion syndrome 01/14/2015  . Migraine without aura and without status migrainosus, not intractable 01/14/2015  . Chronic daily headache 01/14/2015  . Medication overuse headache 01/14/2015  . 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 Left anterior knee pain 12/16/2014    Current Outpatient Medications on File Prior to Visit  Medication Sig Dispense Refill  . Acetaminophen-Codeine 300-30 MG tablet TAKE 1 TABLET BY MOUTH EVERY 8 HOURS FOR 7 DAYS 21 tablet 0  . albuterol (VENTOLIN HFA) 108 (90 Base) MCG/ACT inhaler Inhale 2 puffs into the lungs every 4 (four) hours as needed for wheezing or shortness of breath.    . celecoxib (CELEBREX) 200 MG capsule TAKE 1 CAPSULE BY MOUTH TWICE A DAY 30 capsule 1  . ciprofloxacin (CIPRO) 250 MG  tablet Take 500 mg by mouth 2 (two) times daily.    . cyclobenzaprine (FLEXERIL) 5 MG tablet Take 5 mg by mouth 3 (three) times daily.    Marland Kitchen EPINEPHrine 0.3 mg/0.3 mL IJ SOAJ injection Use as directed for life-threatening allergic reaction. 2 each 3  . famotidine (PEPCID) 20 MG tablet Take 1 tablet (20 mg total) by mouth daily. 90 tablet 3  . fluticasone (FLOVENT HFA) 220 MCG/ACT inhaler Inhale 2 puffs into the lungs 2 (two) times daily. Administer using metered dose inhaler without spacer x 8 weeks 1 each 12  . ibuprofen (ADVIL) 800 MG tablet TAKE 1 TABLET BY MOUTH EVERY 8 HOURS AS NEEDED 30 tablet 0  . loratadine (CLARITIN) 10 MG tablet Can take one tablet by mouth once daily if needed. (Patient not taking: Reported on 10/01/2020) 30 tablet 5  . pantoprazole (PROTONIX) 40 MG tablet TAKE 1 TABLET BY MOUTH TWICE A DAY 180 tablet 1  . PULMICORT 0.5 MG/2ML nebulizer solution Mix one ampule with 10 Splenda packets and swallow twice daily as directed. 120 mL 5  . Spacer/Aero-Holding Dorise Bullion Use with inhaler as directed. 1 each 1   No current facility-administered medications on file prior to visit.    Allergies  Allergen Reactions  . Amoxicillin Rash  . Augmentin [Amoxicillin-Pot Clavulanate] Rash  . Elemental Sulfur Rash  . Sulfa Antibiotics Rash    Objective: There were no vitals filed for this visit.  General: No acute distress, AAOx3  Right foot: Incisions well healed, Capillary fill time <3 seconds in all digits, gross sensation present via  light touch to right foot.  No guarding to right foot and ankle with palpation and range of motion. Strength 5/5 on right in all groups tested.   Xrays no acute finidings   Assessment and Plan:  Problem List Items Addressed This Visit   None   Visit Diagnoses    S/P foot surgery, right    -  Primary   Post-operative state       Arthralgia, unspecified joint       ANA positive       Capsulitis of ankle, right       Tendinitis        Ankle instability, right       Bony exostosis       Arthritis of right foot         -Patient seen and evaluated -Xrays reviewed  -Advised patient to wait on further work up for his left ankle pain until he can see Rheumatologist because this may inflammatory related -Patient to see rheumatology on 5/4 due to positive ANA; Advised him that they may need to change his anti-inflammatory  -Continue with weightbearing with cam boot and may slowly wean to a shoe as tolerated -Work note provided out of work until next office visit -Will plan for final post op check at next visit in 1 month or sooner if issues arise.    Landis Martins, DPM

## 2020-11-01 ENCOUNTER — Other Ambulatory Visit: Payer: Self-pay | Admitting: Sports Medicine

## 2020-11-01 ENCOUNTER — Other Ambulatory Visit: Payer: Self-pay

## 2020-11-01 ENCOUNTER — Ambulatory Visit (INDEPENDENT_AMBULATORY_CARE_PROVIDER_SITE_OTHER): Payer: Medicaid Other | Admitting: Allergy and Immunology

## 2020-11-01 ENCOUNTER — Encounter: Payer: Self-pay | Admitting: Allergy and Immunology

## 2020-11-01 VITALS — BP 118/72 | HR 105 | Resp 16

## 2020-11-01 DIAGNOSIS — J4530 Mild persistent asthma, uncomplicated: Secondary | ICD-10-CM | POA: Diagnosis not present

## 2020-11-01 DIAGNOSIS — K200 Eosinophilic esophagitis: Secondary | ICD-10-CM | POA: Diagnosis not present

## 2020-11-01 DIAGNOSIS — J984 Other disorders of lung: Secondary | ICD-10-CM | POA: Diagnosis not present

## 2020-11-01 DIAGNOSIS — M779 Enthesopathy, unspecified: Secondary | ICD-10-CM

## 2020-11-01 DIAGNOSIS — U071 COVID-19: Secondary | ICD-10-CM

## 2020-11-01 DIAGNOSIS — T781XXD Other adverse food reactions, not elsewhere classified, subsequent encounter: Secondary | ICD-10-CM

## 2020-11-01 DIAGNOSIS — M2550 Pain in unspecified joint: Secondary | ICD-10-CM

## 2020-11-01 DIAGNOSIS — U099 Post covid-19 condition, unspecified: Secondary | ICD-10-CM | POA: Diagnosis not present

## 2020-11-01 MED ORDER — ALBUTEROL SULFATE HFA 108 (90 BASE) MCG/ACT IN AERS: 2.0000 | INHALATION_SPRAY | RESPIRATORY_TRACT | 1 refills | Status: DC | PRN

## 2020-11-01 NOTE — Progress Notes (Signed)
[facility]   Follow-up Note  Referring Provider: Nicoletta Dress, MD Primary Provider: Nicoletta Dress, MD Date of Office Visit: 11/01/2020  Subjective:   Bob Roy (DOB: June 16, 2001) is a 20 y.o. male who returns to the [facility] on 11/01/2020 in re-evaluation of the following:  HPI: Bob Roy returns to this clinic in reevaluation of asthma, history of COVID-19 infection with pulmonary comorbidity, eosinophilic esophagitis, possible food allergy.  His last visit to this clinic was his initial evaluation of 27 September 2020.  His swallowing is going much better at this point in time.  He has noticed that he can swallow things quicker and they do not get stuck in his chest.  He feels like his throat is better.  He has been using his budesonide slurry.  His breathing is going much better.  He feels as though he has no problems with exercise at this point and does not need to use a short acting bronchodilator.  He has had no problems with his nose.  He continues to use Flovent 1 time per day.  He remains away from consuming tree nuts at this point.  He is eating egg and dairy and peanut with no problem.  Allergies as of 11/01/2020      Reactions   Clavulanic Acid    Gluten Meal    Lactase    Misc. Sulfonamide Containing Compounds    Amoxicillin Rash   Augmentin [amoxicillin-pot Clavulanate] Rash   Elemental Sulfur Rash   Sulfa Antibiotics Rash      Medication List      Acetaminophen-Codeine 300-30 MG tablet TAKE 1 TABLET BY MOUTH EVERY 8 HOURS FOR 7 DAYS   albuterol 108 (90 Base) MCG/ACT inhaler Commonly known as: VENTOLIN HFA Inhale 2 puffs into the lungs every 4 (four) hours as needed for wheezing or shortness of breath.   celecoxib 200 MG capsule Commonly known as: CELEBREX TAKE 1 CAPSULE BY MOUTH TWICE A DAY   ciprofloxacin 250 MG tablet Commonly known as: CIPRO Take 500 mg by mouth 2 (two)  times daily.   cyclobenzaprine 5 MG tablet Commonly known as: FLEXERIL Take 5 mg by mouth 3 (three) times daily.   EPINEPHrine 0.3 mg/0.3 mL Soaj injection Commonly known as: EPI-PEN Use as directed for life-threatening allergic reaction.   famotidine 20 MG tablet Commonly known as: PEPCID Take 1 tablet (20 mg total) by mouth daily.   fluticasone 220 MCG/ACT inhaler Commonly known as: FLOVENT HFA Inhale 2 puffs into the lungs 2 (two) times daily. Administer using metered dose inhaler without spacer x 8 weeks   fluticasone 50 MCG/ACT nasal spray Commonly known as: FLONASE SMARTSIG:1-2 Spray(s) Both Nares Daily   gabapentin 300 MG capsule Commonly known as: NEURONTIN Take 1 capsule by mouth 3 (three) times daily.   ibuprofen 800 MG tablet Commonly known as: ADVIL TAKE 1 TABLET BY MOUTH EVERY 8 HOURS AS NEEDED   loratadine 10 MG tablet Commonly known as: CLARITIN Can take one tablet by mouth once daily if needed.   omeprazole 40 MG capsule Commonly known as: PRILOSEC Take 1 capsule by mouth daily.   pantoprazole 40 MG tablet Commonly known as: PROTONIX TAKE 1 TABLET BY MOUTH TWICE A DAY   predniSONE 20 MG tablet Commonly known as: DELTASONE Take 20 mg by mouth daily.   Pulmicort 0.5 MG/2ML nebulizer solution Generic drug: budesonide Mix one ampule with 10 Splenda packets and swallow  twice daily as directed.   Spacer/Aero-Holding [employer] Use with inhaler as directed.   traZODone 100 MG tablet Commonly known as: DESYREL take 1/2 to 1 qhs prn sleep   venlafaxine XR 150 MG 24 hr capsule Commonly known as: EFFEXOR-XR Take by mouth.       Past Medical History:  Diagnosis Date  . Anxiety   . Arthritis   . Asthma   . Depression   . Enlarged prostate   . GERD (gastroesophageal reflux disease)   . HTN (hypertension)   . Obesity   . Seasonal allergies     Past Surgical History:  Procedure Laterality Date  . ANKLE SURGERY Right   . CIRCUMCISION     . KNEE ARTHROSCOPY Left   . Radial fracture surgery Right   . VENTRAL HERNIA REPAIR    . WRIST ARTHROSCOPY Right     Review of systems negative except as noted in HPI / PMHx or noted below:  Review of Systems  Constitutional: Negative.   HENT: Negative.   Eyes: Negative.   Respiratory: Negative.   Cardiovascular: Negative.   Gastrointestinal: Negative.   Genitourinary: Negative.   Musculoskeletal: Negative.   Skin: Negative.   Neurological: Negative.   Endo/Heme/Allergies: Negative.   Psychiatric/Behavioral: Negative.      Objective:   Vitals:   11/01/20 1335  BP: 118/72  Pulse: (!) 105  Resp: 16  SpO2: 97%          Physical Exam Constitutional:      Appearance: He is not diaphoretic.  HENT:     Head: Normocephalic.     Right Ear: Tympanic membrane, ear canal and external ear normal.     Left Ear: Tympanic membrane, ear canal and external ear normal.     Nose: Nose normal. No mucosal edema or rhinorrhea.     Mouth/Throat:     Pharynx: Uvula midline. No oropharyngeal exudate.  Eyes:     Conjunctiva/sclera: Conjunctivae normal.  Neck:     Thyroid: No thyromegaly.     Trachea: Trachea normal. No tracheal tenderness or tracheal deviation.  Cardiovascular:     Rate and Rhythm: Normal rate and regular rhythm.     Heart sounds: Normal heart sounds, S1 normal and S2 normal. No murmur heard.   Pulmonary:     Effort: No respiratory distress.     Breath sounds: Normal breath sounds. No stridor. No wheezing or rales.  Lymphadenopathy:     Head:     Right side of head: No tonsillar adenopathy.     Left side of head: No tonsillar adenopathy.     Cervical: No cervical adenopathy.  Skin:    Findings: No erythema or rash.     Nails: There is no clubbing.  Neurological:     Mental Status: He is alert.     Diagnostics:    Spirometry was performed and demonstrated an FEV1 of 5.35 at 98 % of predicted.  Results of blood tests obtained 28 September 2020 identified  IgE 1896 KU/L, IgE antibodies directed against multiple aeroallergens including pollens, dust mite, cat, dog, mold, cockroach.  IgE antibodies against specific foods identified hazelnut 5.79, walnut 4.72, cashew 0.22, [place] nut 0.60, peanut 4.88, macadamia nut 1.27, pistachio 1.29, almond 2.08 KU/L.  Component IgE antibodies against specific food components identified cor A1 1.55, cor A8 1.93, jug R3 1.97, Ara H3 0.17, Ara H 81.61, Ara H9 3.49, alpha lactalbumin 0.14, beta lactoglobulin 0.13, casein 0.14, ovalbumin 0.49, ovomucoid 0.31 KU/L  Assessment and Plan:   1. Asthma, well controlled, mild persistent   2. COVID-19 with pulmonary comorbidity   3. Eosinophilic esophagitis   4. Adverse food reaction, subsequent encounter     1.  Allergen avoidance measures - Tree nuts. (Maybe dairy, egg, peanut???)  2.  Continue Budesonide slurry - 0.5 gm ampule / Splenda 2 times per day  4. Continue Flovent 220 - 2 inhalations 1 time per day with spacer   5. If needed:   A. Albuterol HFA - 2 inhalations every 4-6 hours  B. Loratadine 10 mg - 1 tablet 1 time per day  C. Epi-Pen, benadryl, MD/ER evaluation for allergic reaction  6. Return to clinic in 8 weeks or earlier if problem. Taper medications???  Stillman is definitely doing better regarding both his airway eosinophilic disease and his esophageal eosinophilic disease on his current plan.  We will have him remain on budesonide slurry and inhaled Flovent for a total of 12 weeks.  He has already utilize this form of treatment for the past 4 weeks and thus I will see him back in this clinic in 8 weeks.  There will probably be an opportunity to taper down his medications at that point.  I have asked him to remain away from tree nut consumption but he is already eating dairy and egg and peanut and he can continue to eat these foods.  Allena Katz, MD Allergy / Immunology [place]

## 2020-11-01 NOTE — Patient Instructions (Addendum)
  1.  Allergen avoidance measures - Tree nuts. (Maybe dairy, egg, peanut???)  2.  Continue Budesonide slurry - 0.5 gm ampule / Splenda 2 times per day  4. Continue Flovent 220 - 2 inhalations 1 time per day with spacer   5. If needed:   A. Albuterol HFA - 2 inhalations every 4-6 hours  B. Loratadine 10 mg - 1 tablet 1 time per day  C. Epi-Pen, benadryl, MD/ER evaluation for allergic reaction  6. Return to clinic in 8 weeks or earlier if problem. Taper medications???

## 2020-11-02 ENCOUNTER — Encounter: Payer: Self-pay | Admitting: Allergy and Immunology

## 2020-11-02 NOTE — Progress Notes (Deleted)
Office Visit Note  Patient: Bob Roy             Date of Birth: 09-22-2000           MRN: [id number]             PCP: Nicoletta Dress, MD Referring: Landis Martins, DPM Visit Date: 11/03/2020 Occupation: _0 @  Subjective:  No chief complaint on file.   History of Present Illness: KHYLER Roy is a 20 y.o. male here for evaluation of positive ANA with bilateral joint pains and delayed healing and recovery after surgery for foot pain.***   Labs reviewed 08/2020 ANA 1:160 nuclear dot DsDNA, RNP, SM, SCl-70, SSA, SSB, chromatin, Jo-1, centromere neg ESR 12 CRP 3 Uric acid 5.2 HLA-B27 RF neg CBC wnl  Activities of Daily Living:  Patient reports morning stiffness for *** {minute/hour:19697}.   Patient {ACTIONS;DENIES/REPORTS:21021675::"Denies"} nocturnal pain.  Difficulty dressing/grooming: {ACTIONS;DENIES/REPORTS:21021675::"Denies"} Difficulty climbing stairs: {ACTIONS;DENIES/REPORTS:21021675::"Denies"} Difficulty getting out of chair: {ACTIONS;DENIES/REPORTS:21021675::"Denies"} Difficulty using hands for taps, buttons, cutlery, and/or writing: {ACTIONS;DENIES/REPORTS:21021675::"Denies"}  No Rheumatology ROS completed.   PMFS History:  Patient Active Problem List   Diagnosis Date Noted  . Contusion of right hand 05/05/2020  . Right wrist pain 09/19/2019  . Lumbar back pain 09/10/2019  . 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 Traumatic tear of triangular fibrocartilage complex of right wrist 07/10/2019  . Umbilical pain 45/62/5638  . Postoperative examination 02/20/2018  . Ventral hernia without obstruction or gangrene 02/06/2018  . Myofascial pain 04/25/2016  . Asthma 03/23/2015  . AR (allergic rhinitis) 03/23/2015  . 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 Other secondary hypertension 01/14/2015  . Postconcussion syndrome 01/14/2015  .  Migraine without aura and without status migrainosus, not intractable 01/14/2015  . Chronic daily headache 01/14/2015  . Medication overuse headache 01/14/2015  . 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 Left anterior knee pain 12/16/2014    Past Medical History:  Diagnosis Date  . Anxiety   . Arthritis   . Asthma   . Depression   . Enlarged prostate   . GERD (gastroesophageal reflux disease)   . HTN (hypertension)   . Obesity   . Seasonal allergies     Family History  Problem Relation Age of Onset  . Migraines Mother        Resolved in adulthood  . Depression Mother   . Anxiety disorder Mother   . Breast cancer Mother        mets to liver  . Clotting disorder Mother   . Asthma Mother   . Cancer Father   . Diabetes Father   . Irritable bowel syndrome Father   . Breast cancer Maternal Grandmother   . Ulcerative colitis Maternal Grandmother   . Cancer Paternal Grandmother        type unknown  . Prostate cancer Paternal Grandfather   . Kidney disease Paternal Grandfather   . Epilepsy Cousin   . Colon cancer Neg Hx   . Esophageal cancer Neg Hx   . Stomach cancer Neg Hx   . Rectal cancer Neg Hx    Past Surgical History:  Procedure Laterality Date  . ANKLE SURGERY Right   . CIRCUMCISION    . KNEE ARTHROSCOPY Left   . Radial fracture surgery Right   . VENTRAL HERNIA REPAIR    . WRIST ARTHROSCOPY Right    Social History   Social History Narrative  .  Not on file    There is no immunization history on file for this patient.   Objective: Vital Signs: There were no vitals taken for this visit.   Physical Exam   Musculoskeletal Exam: ***  CDAI Exam: CDAI Score: -- Patient Global: --; Provider Global: -- Swollen: --; Tender: -- Joint Exam 11/03/2020   No joint exam has been documented for this visit   There is currently no information documented on the homunculus. Go to the Rheumatology activity and complete the homunculus joint  exam.  Investigation: No additional findings.  Imaging: DG Ankle Complete Right  Result Date: 11/01/2020 Please see detailed radiograph report in office note.   Recent Labs: Lab Results  Component Value Date   WBC 6.9 09/28/2020   HGB 13.7 09/28/2020   PLT 311 09/28/2020   BILITOT 0.4 06/15/2020   ALKPHOS 69 06/15/2020   AST 15 06/15/2020   ALT 15 06/15/2020   PROT 7.6 06/15/2020   ALBUMIN 4.6 06/15/2020    Speciality Comments: No specialty comments available.  Procedures:  No procedures performed Allergies: Clavulanic acid, Gluten meal, Lactase, Misc. sulfonamide containing compounds, Amoxicillin, Augmentin [amoxicillin-pot clavulanate], Elemental sulfur, and Sulfa antibiotics   Assessment / Plan:     Visit Diagnoses: No diagnosis found.  Orders: No orders of the defined types were placed in this encounter.  No orders of the defined types were placed in this encounter.   Face-to-face time spent with patient was *** minutes. Greater than 50% of time was spent in counseling and coordination of care.  Follow-Up Instructions: No follow-ups on file.   Collier Salina, MD  Note - This record has been created using [employer].  Chart creation errors have been sought, but may not always  have been located. Such creation errors do not reflect on  the standard of medical care.

## 2020-11-03 ENCOUNTER — Ambulatory Visit: Payer: Medicaid Other | Admitting: Internal Medicine

## 2020-11-08 ENCOUNTER — Other Ambulatory Visit: Payer: Self-pay | Admitting: Sports Medicine

## 2020-11-08 ENCOUNTER — Telehealth: Payer: Self-pay

## 2020-11-08 DIAGNOSIS — Z9889 Other specified postprocedural states: Secondary | ICD-10-CM

## 2020-11-08 MED ORDER — IBUPROFEN 800 MG PO TABS: 800.0000 mg | ORAL_TABLET | Freq: Three times a day (TID) | ORAL | 0 refills | Status: DC | PRN

## 2020-11-08 NOTE — Telephone Encounter (Signed)
Contacted pt and LVM stating that per Dr. Cannon Kettle he can not take Ibu and celebrex at the same time since it can cause GI problems.  Pt was also advised that a RF of IBU was sent to his pharmacy

## 2020-11-08 NOTE — Progress Notes (Signed)
Refilled Ibuprofen and advised patient to not take celebrex while on this medicaton since both are NSAIDs -Dr. Cannon Kettle

## 2020-11-08 NOTE — Telephone Encounter (Signed)
Pt called requesting a RF on IBU and celebrex for 1 month supply. Please advice

## 2020-11-18 ENCOUNTER — Other Ambulatory Visit: Payer: Self-pay | Admitting: Sports Medicine

## 2020-11-18 ENCOUNTER — Other Ambulatory Visit: Payer: Self-pay | Admitting: Gastroenterology

## 2020-11-18 DIAGNOSIS — Z9889 Other specified postprocedural states: Secondary | ICD-10-CM

## 2020-11-19 ENCOUNTER — Encounter: Payer: Medicaid Other | Admitting: Sports Medicine

## 2020-11-19 NOTE — Telephone Encounter (Signed)
Please advise 

## 2020-11-23 ENCOUNTER — Ambulatory Visit: Payer: Medicaid Other | Admitting: Gastroenterology

## 2020-12-07 ENCOUNTER — Other Ambulatory Visit: Payer: Self-pay

## 2020-12-07 MED ORDER — LORATADINE 10 MG PO TABS: ORAL_TABLET | ORAL | 3 refills | Status: DC

## 2020-12-07 MED ORDER — FLUTICASONE PROPIONATE 50 MCG/ACT NA SUSP: NASAL | 3 refills | Status: DC

## 2020-12-11 ENCOUNTER — Other Ambulatory Visit: Payer: Self-pay | Admitting: Allergy and Immunology

## 2020-12-19 NOTE — Progress Notes (Deleted)
Office Visit Note  Patient: Bob Roy             Date of Birth: December 02, 2000           MRN: [id number]             PCP: Nicoletta Dress, MD Referring: Landis Martins, DPM Visit Date: 12/20/2020 Occupation: @GUAROCC @  Subjective:  No chief complaint on file.   History of Present Illness: Bob Roy is a 20 y.o. male here for evaluation of positive ANA with multiple joint pains also slow to recover recent podiatry surgery.***   Labs reviewed ANA 1:160 nuclear dot dsDNA, RNP, Smith, SSA, SSB, Scl-70, chromatin, Jo-1, centromere neg     Activities of Daily Living:  Patient reports morning stiffness for *** {minute/hour:19697}.   Patient {ACTIONS;DENIES/REPORTS:21021675::"Denies"} nocturnal pain.  Difficulty dressing/grooming: {ACTIONS;DENIES/REPORTS:21021675::"Denies"} Difficulty climbing stairs: {ACTIONS;DENIES/REPORTS:21021675::"Denies"} Difficulty getting out of chair: {ACTIONS;DENIES/REPORTS:21021675::"Denies"} Difficulty using hands for taps, buttons, cutlery, and/or writing: {ACTIONS;DENIES/REPORTS:21021675::"Denies"}  No Rheumatology ROS completed.   PMFS History:  Patient Active Problem List   Diagnosis Date Noted   Contusion of right hand 05/05/2020   Right wrist pain 09/19/2019   Lumbar back pain 09/10/2019   Displaced fracture of right radial styloid process, subsequent encounter for closed fracture with nonunion 07/10/2019   Traumatic tear of triangular fibrocartilage complex of right wrist 91/63/8466   Umbilical pain 59/93/5701   Postoperative examination 02/20/2018   Ventral hernia without obstruction or gangrene 02/06/2018   Myofascial pain 04/25/2016   Asthma 03/23/2015   AR (allergic rhinitis) 03/23/2015   LPRD (laryngopharyngeal reflux disease) 03/23/2015   Other secondary hypertension 01/14/2015   Postconcussion syndrome 01/14/2015   Migraine without aura and without status migrainosus, not intractable 01/14/2015   Chronic  daily headache 01/14/2015   Medication overuse headache 01/14/2015   Patellar instability of left knee 12/25/2014   Left anterior knee pain 12/16/2014    Past Medical History:  Diagnosis Date   Anxiety    Arthritis    Asthma    Depression    Enlarged prostate    GERD (gastroesophageal reflux disease)    HTN (hypertension)    Obesity    Seasonal allergies     Family History  Problem Relation Age of Onset   Migraines Mother        Resolved in adulthood   Depression Mother    Anxiety disorder Mother    Breast cancer Mother        mets to liver   Clotting disorder Mother    Asthma Mother    Cancer Father    Diabetes Father    Irritable bowel syndrome Father    Breast cancer Maternal Grandmother    Ulcerative colitis Maternal Grandmother    Cancer Paternal Grandmother        type unknown   Prostate cancer Paternal Grandfather    Kidney disease Paternal Grandfather    Epilepsy Cousin    Colon cancer Neg Hx    Esophageal cancer Neg Hx    Stomach cancer Neg Hx    Rectal cancer Neg Hx    Past Surgical History:  Procedure Laterality Date   ANKLE SURGERY Right    CIRCUMCISION     KNEE ARTHROSCOPY Left    Radial fracture surgery Right    VENTRAL HERNIA REPAIR     WRIST ARTHROSCOPY Right    Social History   Social History Narrative   Not on file    There is no immunization history on file  for this patient.   Objective: Vital Signs: There were no vitals taken for this visit.   Physical Exam   Musculoskeletal Exam:   CDAI Exam: CDAI Score: -- Patient Global: --; Provider Global: -- Swollen: --; Tender: -- Joint Exam 12/20/2020   No joint exam has been documented for this visit   There is currently no information documented on the homunculus. Go to the Rheumatology activity and complete the homunculus joint exam.  Investigation: No additional findings.  Imaging: No results found.  Recent Labs: Lab Results  Component Value Date   WBC 6.9 09/28/2020    HGB 13.7 09/28/2020   PLT 311 09/28/2020   BILITOT 0.4 06/15/2020   ALKPHOS 69 06/15/2020   AST 15 06/15/2020   ALT 15 06/15/2020   PROT 7.6 06/15/2020   ALBUMIN 4.6 06/15/2020    Speciality Comments: No specialty comments available.  Procedures:  No procedures performed Allergies: Clavulanic acid, Gluten meal, Lactase, Misc. sulfonamide containing compounds, Amoxicillin, Augmentin [amoxicillin-pot clavulanate], Elemental sulfur, and Sulfa antibiotics   Assessment / Plan:     Visit Diagnoses: No diagnosis found.  Orders: No orders of the defined types were placed in this encounter.  No orders of the defined types were placed in this encounter.   Face-to-face time spent with patient was *** minutes. Greater than 50% of time was spent in counseling and coordination of care.  Follow-Up Instructions: No follow-ups on file.   Collier Salina, MD  Note - This record has been created using [employer].  Chart creation errors have been sought, but may not always  have been located. Such creation errors do not reflect on  the standard of medical care.

## 2020-12-20 ENCOUNTER — Ambulatory Visit: Payer: Medicaid Other | Admitting: Internal Medicine

## 2020-12-27 ENCOUNTER — Ambulatory Visit: Payer: Medicaid Other | Admitting: Allergy and Immunology

## 2021-01-06 ENCOUNTER — Other Ambulatory Visit: Payer: Self-pay | Admitting: Sports Medicine

## 2021-01-06 DIAGNOSIS — Z9889 Other specified postprocedural states: Secondary | ICD-10-CM

## 2021-01-06 NOTE — Telephone Encounter (Signed)
Please advise 

## 2021-01-09 ENCOUNTER — Other Ambulatory Visit: Payer: Self-pay | Admitting: Sports Medicine

## 2021-01-09 DIAGNOSIS — Z9889 Other specified postprocedural states: Secondary | ICD-10-CM

## 2021-01-11 NOTE — Telephone Encounter (Signed)
Please advise 

## 2021-01-13 ENCOUNTER — Other Ambulatory Visit: Payer: Self-pay | Admitting: Gastroenterology

## 2021-01-13 ENCOUNTER — Other Ambulatory Visit: Payer: Self-pay | Admitting: Allergy and Immunology

## 2021-01-13 DIAGNOSIS — G8929 Other chronic pain: Secondary | ICD-10-CM

## 2021-01-13 DIAGNOSIS — K200 Eosinophilic esophagitis: Secondary | ICD-10-CM

## 2021-01-13 DIAGNOSIS — K219 Gastro-esophageal reflux disease without esophagitis: Secondary | ICD-10-CM

## 2021-01-13 NOTE — Telephone Encounter (Signed)
Please advise to flonase refill as it is not listed on last avs

## 2021-01-31 ENCOUNTER — Ambulatory Visit: Payer: Medicaid Other | Admitting: Allergy and Immunology

## 2021-02-02 DIAGNOSIS — T148XXA Other injury of unspecified body region, initial encounter: Secondary | ICD-10-CM | POA: Insufficient documentation

## 2021-02-13 ENCOUNTER — Other Ambulatory Visit: Payer: Self-pay | Admitting: Sports Medicine

## 2021-02-13 DIAGNOSIS — Z9889 Other specified postprocedural states: Secondary | ICD-10-CM

## 2021-02-14 NOTE — Telephone Encounter (Signed)
Please advise 

## 2021-02-23 HISTORY — PX: NASAL SEPTUM SURGERY: SHX37

## 2021-03-16 ENCOUNTER — Ambulatory Visit: Payer: Medicaid Other | Admitting: Allergy and Immunology

## 2021-04-08 ENCOUNTER — Other Ambulatory Visit: Payer: Self-pay | Admitting: *Deleted

## 2021-04-08 MED ORDER — EPINEPHRINE 0.3 MG/0.3ML IJ SOAJ: INTRAMUSCULAR | 0 refills | Status: DC

## 2021-04-19 ENCOUNTER — Other Ambulatory Visit: Payer: Self-pay | Admitting: *Deleted

## 2021-04-19 MED ORDER — EPIPEN 2-PAK 0.3 MG/0.3ML IJ SOAJ: INTRAMUSCULAR | 3 refills | Status: DC

## 2021-04-22 ENCOUNTER — Ambulatory Visit: Payer: Medicaid Other | Admitting: Gastroenterology

## 2021-05-10 ENCOUNTER — Encounter: Payer: Self-pay | Admitting: Sports Medicine

## 2021-05-10 ENCOUNTER — Ambulatory Visit (INDEPENDENT_AMBULATORY_CARE_PROVIDER_SITE_OTHER): Payer: Medicaid Other | Admitting: Sports Medicine

## 2021-05-10 ENCOUNTER — Ambulatory Visit (INDEPENDENT_AMBULATORY_CARE_PROVIDER_SITE_OTHER): Payer: Medicaid Other

## 2021-05-10 ENCOUNTER — Other Ambulatory Visit: Payer: Self-pay | Admitting: Sports Medicine

## 2021-05-10 ENCOUNTER — Other Ambulatory Visit: Payer: Self-pay

## 2021-05-10 DIAGNOSIS — M2142 Flat foot [pes planus] (acquired), left foot: Secondary | ICD-10-CM

## 2021-05-10 DIAGNOSIS — M25571 Pain in right ankle and joints of right foot: Secondary | ICD-10-CM | POA: Diagnosis not present

## 2021-05-10 DIAGNOSIS — M2141 Flat foot [pes planus] (acquired), right foot: Secondary | ICD-10-CM

## 2021-05-10 DIAGNOSIS — M19071 Primary osteoarthritis, right ankle and foot: Secondary | ICD-10-CM

## 2021-05-10 DIAGNOSIS — L905 Scar conditions and fibrosis of skin: Secondary | ICD-10-CM | POA: Diagnosis not present

## 2021-05-10 DIAGNOSIS — M779 Enthesopathy, unspecified: Secondary | ICD-10-CM | POA: Diagnosis not present

## 2021-05-10 DIAGNOSIS — G8929 Other chronic pain: Secondary | ICD-10-CM

## 2021-05-10 MED ORDER — PREDNISONE 10 MG (21) PO TBPK: ORAL_TABLET | ORAL | 0 refills | Status: DC

## 2021-05-10 MED ORDER — DICLOFENAC EPOLAMINE 1.3 % EX PTCH
1.0000 | MEDICATED_PATCH | Freq: Two times a day (BID) | CUTANEOUS | 0 refills | Status: DC
Start: 2021-05-10 — End: 2021-06-03

## 2021-05-10 MED ORDER — TRIAMCINOLONE ACETONIDE 10 MG/ML IJ SUSP
10.0000 mg | Freq: Once | INTRAMUSCULAR | Status: AC
Start: 2021-05-10 — End: 2021-05-10
  Administered 2021-05-10: 10 mg

## 2021-05-10 NOTE — Progress Notes (Signed)
Subjective: Bob Roy is a 20 y.o. male patient seen today in office for Right foot/ankle pain at area of previous surgery. States that pain has been getting worse since June. Started a new job pain 8/10 sharp when walking and standing, states that pain is severe has been using brace and insoles.   Patient Active Problem List   Diagnosis Date Noted   Contusion of right hand 05/05/2020   Right wrist pain 09/19/2019   Lumbar back pain 09/10/2019   Displaced fracture of right radial styloid process, subsequent encounter for closed fracture with nonunion 07/10/2019   Traumatic tear of triangular fibrocartilage complex of right wrist 38/88/2800   Umbilical pain 34/91/7915   Postoperative examination 02/20/2018   Ventral hernia without obstruction or gangrene 02/06/2018   Myofascial pain 04/25/2016   Asthma 03/23/2015   AR (allergic rhinitis) 03/23/2015   LPRD (laryngopharyngeal reflux disease) 03/23/2015   Other secondary hypertension 01/14/2015   Postconcussion syndrome 01/14/2015   Migraine without aura and without status migrainosus, not intractable 01/14/2015   Chronic daily headache 01/14/2015   Medication overuse headache 01/14/2015   Patellar instability of left knee 12/25/2014   Left anterior knee pain 12/16/2014    Current Outpatient Medications on File Prior to Visit  Medication Sig Dispense Refill   Acetaminophen-Codeine 300-30 MG tablet TAKE 1 TABLET BY MOUTH EVERY 8 HOURS FOR 7 DAYS 21 tablet 0   albuterol (PROAIR HFA) 108 (90 Base) MCG/ACT inhaler Can inhale two puffs every four to six hours as needed for cough or wheeze. 8.5 each 0   celecoxib (CELEBREX) 200 MG capsule TAKE 1 CAPSULE BY MOUTH TWICE A DAY 30 capsule 1   ciprofloxacin (CIPRO) 250 MG tablet Take 500 mg by mouth 2 (two) times daily.     cyclobenzaprine (FLEXERIL) 5 MG tablet Take 5 mg by mouth 3 (three) times daily.     EPIPEN 2-PAK 0.3 MG/0.3ML SOAJ injection Use as directed for life threatening  allergic reactions 4 each 3   famotidine (PEPCID) 20 MG tablet TAKE 1 TABLET BY MOUTH EVERY DAY 90 tablet 3   FLOVENT HFA 220 MCG/ACT inhaler INHALE 2 PUFFS INTO THE LUNGS 2 (TWO) TIMES DAILY. ADMINISTER USING METERED DOSE INHALER WITHOUT SPACER X 8 WEEKS 36 each 4   fluticasone (FLONASE) 50 MCG/ACT nasal spray USE 1-2 SPRAYS IN EACH NOSTRIL DAILY 16 mL 11   gabapentin (NEURONTIN) 300 MG capsule Take 1 capsule by mouth 3 (three) times daily.     ibuprofen (ADVIL) 800 MG tablet TAKE 1 TABLET BY MOUTH EVERY 8 HOURS AS NEEDED 90 tablet 0   loratadine (CLARITIN) 10 MG tablet CAN TAKE ONE TABLET BY MOUTH ONCE DAILY IF NEEDED. 30 tablet 11   omeprazole (PRILOSEC) 40 MG capsule Take 1 capsule by mouth daily.     pantoprazole (PROTONIX) 40 MG tablet TAKE 1 TABLET BY MOUTH TWICE A DAY 180 tablet 1   PULMICORT 0.5 MG/2ML nebulizer solution MIX ONE AMPULE WITH 10 SPLENDA PACKETS AND SWALLOW TWICE DAILY AS DIRECTED. 360 mL 1   Spacer/Aero-Holding Dorise Bullion Use with inhaler as directed. 1 each 1   traZODone (DESYREL) 100 MG tablet take 1/2 to 1 qhs prn sleep     venlafaxine XR (EFFEXOR-XR) 150 MG 24 hr capsule Take by mouth.     No current facility-administered medications on file prior to visit.    Allergies  Allergen Reactions   Clavulanic Acid    Gluten Meal    Misc. Sulfonamide Containing Compounds  Tilactase    Amoxicillin Rash   Augmentin [Amoxicillin-Pot Clavulanate] Rash   Elemental Sulfur Rash   Sulfa Antibiotics Rash    Objective: There were no vitals filed for this visit.  General: No acute distress, AAOx3  Right foot: Incisions well healed, There is scar with small lumps at the sinus tarsi and lateal ankle at area of most pain, Capillary fill time <3 seconds in all digits, gross sensation present via light touch to right foot.  + guarding to right foot and ankle with palpation and range of motion. Strength 5/5 on right in all groups tested.   Xrays no acute findings, no  residual bone spur  Assessment and Plan:  Problem List Items Addressed This Visit   None Visit Diagnoses     Arthritis of right foot    -  Primary   Relevant Medications   predniSONE (STERAPRED UNI-PAK 21 TAB) 10 MG (21) TBPK tablet   Other Relevant Orders   DG Ankle Complete Right   DG Foot 2 Views Right   Pes planus of both feet       Relevant Orders   DG Ankle Complete Right   DG Foot 2 Views Right   Chronic pain of right ankle       Relevant Medications   predniSONE (STERAPRED UNI-PAK 21 TAB) 10 MG (21) TBPK tablet   Scar       Capsulitis          -Patient seen and evaluated -Xrays reviewed  After oral consent and aseptic prep, injected a mixture containing 1 ml of 2%  plain lidocaine, 1 ml 0.5% plain marcaine, 0.5 ml of kenalog 10 and 0.5 ml of dexamethasone phosphate into right dorsolateral ankle at area of max pain without complication. Post-injection care discussed with patient.   -Rx predisone and advised patient for additional pain may take tylenol and made him aware he must try these meds before I can consider giving him anything else for pain -Advised patient that it may still be a good idea to follow up with Rheumatologist for his + ANA in the past that has never been addressed  -Advised patient that is symptoms fail to get better may require a MRI for further evaluation  -Return in 3 weeks for follow up evaluation or sooner if problems arise.    Landis Martins, DPM

## 2021-05-11 NOTE — Telephone Encounter (Signed)
Please advise 

## 2021-05-24 ENCOUNTER — Other Ambulatory Visit: Payer: Self-pay | Admitting: Sports Medicine

## 2021-05-24 DIAGNOSIS — G8929 Other chronic pain: Secondary | ICD-10-CM

## 2021-05-24 NOTE — Telephone Encounter (Signed)
Returned the call to patient, no answer,left vmessage of Dr Leeanne Rio recommendations

## 2021-05-24 NOTE — Progress Notes (Signed)
Electronic PA done for patient in Cover my meds for flector patch This request has received a Favorable outcome.  Please note any additional information provided by [facility] at the bottom of this request.

## 2021-06-03 NOTE — Telephone Encounter (Signed)
Patient has  received Dr Leeanne Rio recommendations.

## 2021-06-07 ENCOUNTER — Other Ambulatory Visit: Payer: Self-pay | Admitting: Allergy and Immunology

## 2021-06-07 ENCOUNTER — Other Ambulatory Visit: Payer: Self-pay | Admitting: Sports Medicine

## 2021-06-10 ENCOUNTER — Ambulatory Visit: Payer: Medicaid Other | Admitting: Sports Medicine

## 2021-07-08 ENCOUNTER — Ambulatory Visit (INDEPENDENT_AMBULATORY_CARE_PROVIDER_SITE_OTHER): Payer: Medicaid Other

## 2021-07-08 ENCOUNTER — Other Ambulatory Visit: Payer: Self-pay

## 2021-07-08 ENCOUNTER — Other Ambulatory Visit: Payer: Self-pay | Admitting: Sports Medicine

## 2021-07-08 ENCOUNTER — Encounter: Payer: Self-pay | Admitting: Sports Medicine

## 2021-07-08 ENCOUNTER — Ambulatory Visit: Payer: Medicaid Other | Admitting: Sports Medicine

## 2021-07-08 DIAGNOSIS — S99922A Unspecified injury of left foot, initial encounter: Secondary | ICD-10-CM

## 2021-07-08 DIAGNOSIS — M25571 Pain in right ankle and joints of right foot: Secondary | ICD-10-CM

## 2021-07-08 DIAGNOSIS — G8929 Other chronic pain: Secondary | ICD-10-CM

## 2021-07-08 DIAGNOSIS — M779 Enthesopathy, unspecified: Secondary | ICD-10-CM

## 2021-07-08 DIAGNOSIS — L905 Scar conditions and fibrosis of skin: Secondary | ICD-10-CM

## 2021-07-08 DIAGNOSIS — M7752 Other enthesopathy of left foot: Secondary | ICD-10-CM

## 2021-07-08 DIAGNOSIS — Z9889 Other specified postprocedural states: Secondary | ICD-10-CM

## 2021-07-08 DIAGNOSIS — S93402A Sprain of unspecified ligament of left ankle, initial encounter: Secondary | ICD-10-CM

## 2021-07-08 DIAGNOSIS — M19071 Primary osteoarthritis, right ankle and foot: Secondary | ICD-10-CM

## 2021-07-08 DIAGNOSIS — S99912A Unspecified injury of left ankle, initial encounter: Secondary | ICD-10-CM

## 2021-07-08 DIAGNOSIS — R768 Other specified abnormal immunological findings in serum: Secondary | ICD-10-CM

## 2021-07-08 DIAGNOSIS — M2550 Pain in unspecified joint: Secondary | ICD-10-CM

## 2021-07-08 MED ORDER — TRAMADOL HCL 50 MG PO TABS: 50.0000 mg | ORAL_TABLET | Freq: Three times a day (TID) | ORAL | 0 refills | Status: AC | PRN

## 2021-07-08 MED ORDER — IBUPROFEN 800 MG PO TABS: 800.0000 mg | ORAL_TABLET | Freq: Three times a day (TID) | ORAL | 0 refills | Status: DC | PRN

## 2021-07-08 MED ORDER — ACETAMINOPHEN 500 MG PO TABS: 500.0000 mg | ORAL_TABLET | Freq: Four times a day (QID) | ORAL | 0 refills | Status: DC | PRN

## 2021-07-08 NOTE — Progress Notes (Signed)
Subjective: Bob Roy is a 21 y.o. male patient seen today in office for follow-up evaluation right foot/ankle pain at area of previous surgery. States that pain is really bad by the end of the day he limps but has a new problem where he also twisted his foot on the left 3 days ago and has some soreness in the ankle after rolling off a curb.  Patient reports even though he sprained his left ankle his right ankle hurts most currently using pain patches and taking a lot of Tylenol and Motrin every day.  Patient denies any other pedal complaints at this time.  Patient reports that he did try to follow-up with the rheumatologist but they needed a new referral sent but he says it has been a long time since the last referral.  Patient denies any other pedal complaints at this time.    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     celecoxib (CELEBREX) 200 MG capsule Take by mouth.     famotidine (PEPCID) 20 MG tablet Take by mouth.     fluticasone (FLONASE) 50 MCG/ACT nasal spray Place into the nose.     gabapentin (NEURONTIN) 300 MG capsule Take by mouth.     loratadine (CLARITIN) 10 MG tablet Take by mouth.     meclizine (ANTIVERT) 25 MG tablet Take by mouth.     meloxicam (MOBIC) 15 MG tablet      ondansetron (ZOFRAN) 4 MG tablet TAKE 1 TO 2 TABLETS BY MOUTH EVERY 8 HOURS AS NEEDED FOR VOMITING     oxyCODONE (OXY IR/ROXICODONE) 5 MG immediate release tablet      pantoprazole (PROTONIX) 40 MG tablet Take by mouth.     Acetaminophen-Codeine 300-30 MG tablet TAKE 1 TABLET BY MOUTH EVERY 8 HOURS FOR 7 DAYS 21 tablet 0   albuterol (PROAIR HFA) 108 (90 Base) MCG/ACT inhaler Can inhale two puffs every four to six hours as needed for cough or wheeze. 8.5 each 0   azithromycin (ZITHROMAX) 250 MG tablet Take by mouth.     benzonatate (TESSALON) 100 MG capsule Take 100 mg by mouth 3 (three) times daily as needed.     BINAXNOW COVID-19 AG HOME TEST KIT See admin instructions.     cefdinir (OMNICEF) 300 MG capsule Take 300 mg by mouth 2 (two) times daily.     celecoxib (CELEBREX) 200 MG capsule TAKE 1 CAPSULE BY MOUTH TWICE A DAY 30 capsule 1   ciprofloxacin (  CIPRO) 250 MG tablet Take 500 mg by mouth 2 (two) times daily.     ciprofloxacin (CIPRO) 500 MG tablet Take by mouth.     cyclobenzaprine (FLEXERIL) 5 MG tablet Take 5 mg by mouth 3 (three) times daily.     EPIPEN 2-PAK 0.3 MG/0.3ML SOAJ injection Use as directed for life threatening allergic reactions 4 each 3   famotidine (PEPCID) 20 MG tablet TAKE 1 TABLET BY MOUTH EVERY DAY 90 tablet 3   FLECTOR 1.3 % PTCH USE 1  TOPICALLY TWICE DAILY 30 patch 2   FLOVENT HFA 220 MCG/ACT inhaler INHALE 2 PUFFS INTO THE LUNGS 2 (TWO) TIMES DAILY. ADMINISTER USING METERED DOSE INHALER WITHOUT SPACER X 8 WEEKS 36 each 4   fluticasone (FLONASE) 50 MCG/ACT nasal spray  USE 1-2 SPRAYS IN EACH NOSTRIL DAILY 16 mL 11   gabapentin (NEURONTIN) 300 MG capsule Take 1 capsule by mouth 3 (three) times daily.     ibuprofen (ADVIL) 800 MG tablet TAKE 1 TABLET BY MOUTH EVERY 8 HOURS AS NEEDED 90 tablet 0   ketorolac (TORADOL) 10 MG tablet Take 10 mg by mouth every 6 (six) hours as needed.     levofloxacin (LEVAQUIN) 750 MG tablet Take 750 mg by mouth daily.     loratadine (CLARITIN) 10 MG tablet CAN TAKE ONE TABLET BY MOUTH ONCE DAILY IF NEEDED. 30 tablet 11   methocarbamol (ROBAXIN) 500 MG tablet Take 1,000 mg by mouth 3 (three) times daily.     mirtazapine (REMERON SOL-TAB) 15 MG disintegrating tablet Take 15 mg by mouth at bedtime.     mupirocin ointment (BACTROBAN) 2 % Apply topically 2 (two) times daily.     omeprazole (PRILOSEC) 40 MG capsule Take 1 capsule by mouth daily.     ondansetron (ZOFRAN-ODT) 4 MG disintegrating tablet Take 4 mg by mouth every 6 (six) hours as needed.     pantoprazole (PROTONIX) 40 MG tablet TAKE 1 TABLET BY MOUTH TWICE A DAY 180 tablet 1   phenazopyridine (PYRIDIUM) 200 MG tablet Take 200 mg by mouth 3 (three) times daily.     predniSONE (STERAPRED UNI-PAK 21 TAB) 10 MG (21) TBPK tablet Take as directed 21 tablet 0   PULMICORT 0.5 MG/2ML nebulizer solution MIX ONE AMPULE WITH 10 SPLENDA PACKETS AND SWALLOW TWICE DAILY AS DIRECTED. 360 mL 1   Spacer/Aero-Holding Dorise Bullion Use with inhaler as directed. 1 each 1   tamsulosin (FLOMAX) 0.4 MG CAPS capsule Take 0.4 mg by mouth at bedtime.     traZODone (DESYREL) 100 MG tablet take 1/2 to 1 qhs prn sleep     venlafaxine XR (EFFEXOR-XR) 150 MG 24 hr capsule Take by mouth.     No current facility-administered medications on file prior to visit.    Allergies  Allergen Reactions   Clavulanic Acid    Gluten Meal    Misc. Sulfonamide Containing Compounds    Tilactase    Amoxicillin Rash   Augmentin [Amoxicillin-Pot Clavulanate] Rash   Elemental Sulfur Rash   Sulfa Antibiotics Rash     Objective: There were no vitals filed for this visit.  General: No acute distress, AAOx3  Derm: Incision well-healed with mild scar noted over the dorsal lateral ankle  Neurovascular: Unchanged from prior Orthopedic: There is pain to palpation to the dorsal lateral foot and ankle bilateral pain is worse on the right over the lateral ankle ligaments and sinus tarsi area more so than the left at area of most recent sprain there  is laxity noted on the left with inversion however on the right the foot and ankle appears to be stable there is of note a hypertrophic scar with likely scar tissue to this area that could be also adding to the patient's pain.  Xrays no osseous acute findings  Assessment and Plan:  Problem List Items Addressed This Visit   None Visit Diagnoses     Injury of left ankle and foot, initial encounter    -  Primary   Relevant Orders   DG Foot 2 Views Left   DG Ankle Complete Left   Chronic pain of right ankle       Relevant Medications   celecoxib (CELEBREX) 200 MG capsule   gabapentin (NEURONTIN) 300 MG capsule   ketorolac (TORADOL) 10 MG tablet   meloxicam (MOBIC) 15 MG tablet   methocarbamol (ROBAXIN) 500 MG tablet   mirtazapine (REMERON SOL-TAB) 15 MG disintegrating tablet   oxyCODONE (OXY IR/ROXICODONE) 5 MG immediate release tablet   traMADol (ULTRAM) 50 MG tablet   ibuprofen (ADVIL) 800 MG tablet   acetaminophen (TYLENOL) 500 MG tablet   Other Relevant Orders   MR ANKLE RIGHT WO CONTRAST   Ambulatory referral to Rheumatology   Arthritis of right foot       Relevant Medications   celecoxib (CELEBREX) 200 MG capsule   ketorolac (TORADOL) 10 MG tablet   meloxicam (MOBIC) 15 MG tablet   methocarbamol (ROBAXIN) 500 MG tablet   oxyCODONE (OXY IR/ROXICODONE) 5 MG immediate release tablet   traMADol (ULTRAM) 50 MG tablet   ibuprofen (ADVIL) 800 MG tablet   acetaminophen (TYLENOL) 500 MG tablet   Scar       Capsulitis       S/P foot surgery, right        Mild ankle sprain, left, initial encounter       Arthralgia, unspecified joint       Relevant Orders   Ambulatory referral to Rheumatology   ANA positive       Relevant Orders   Ambulatory referral to Rheumatology        -Patient seen and evaluated -Xrays reviewed  -Discussed treatment options for chronic right ankle pain and a new pain secondary to ankle sprain that happened 3 days ago -Advised patient to continue with wrap to the right ankle and brace to the left ankle -Advised patient to continue with Flector pain patch to the right lateral ankle -Discussed with patient chronic pain on the right ankle and at this time we will reorder a MRI to further evaluate I advised patient that surgery may not be the answer however we must await the results before we determine what can be done more for his right ankle -Referral made to rheumatology for additional opinion on extensive pain with previous history of positive ANA -Prescribe tramadol for any pain not relieved by Tylenol and Motrin -Return after MRI and rheumatology follow-up or sooner if problems arise.    Landis Martins, DPM

## 2021-07-08 NOTE — Progress Notes (Signed)
Rheumatology referral has been denied due to previous no-show with patient and cancellation the doctors at [facility] rheumatology will not see patient.

## 2021-07-10 ENCOUNTER — Encounter: Payer: Self-pay | Admitting: Gastroenterology

## 2021-07-11 ENCOUNTER — Other Ambulatory Visit: Payer: Self-pay

## 2021-07-11 DIAGNOSIS — K200 Eosinophilic esophagitis: Secondary | ICD-10-CM

## 2021-07-11 DIAGNOSIS — G8929 Other chronic pain: Secondary | ICD-10-CM

## 2021-07-11 DIAGNOSIS — K219 Gastro-esophageal reflux disease without esophagitis: Secondary | ICD-10-CM

## 2021-07-11 DIAGNOSIS — R1011 Right upper quadrant pain: Secondary | ICD-10-CM

## 2021-07-11 MED ORDER — FAMOTIDINE 20 MG PO TABS: 20.0000 mg | ORAL_TABLET | Freq: Every day | ORAL | 0 refills | Status: AC

## 2021-07-11 MED ORDER — PANTOPRAZOLE SODIUM 40 MG PO TBEC: 40.0000 mg | DELAYED_RELEASE_TABLET | Freq: Two times a day (BID) | ORAL | 0 refills | Status: DC

## 2021-07-11 MED ORDER — PANTOPRAZOLE SODIUM 40 MG PO TBEC: 40.0000 mg | DELAYED_RELEASE_TABLET | Freq: Two times a day (BID) | ORAL | 0 refills | Status: AC

## 2021-07-14 ENCOUNTER — Telehealth: Payer: Self-pay

## 2021-07-14 NOTE — Telephone Encounter (Signed)
This very same issue was forwarded to Irwin Brakeman on 07/13/20 to begin pt request for transfer of care and as approved by Dr. Tarri Glenn. Routing this message to the appropriate parties to address with pt directly.

## 2021-07-14 NOTE — Telephone Encounter (Signed)
Left message for patient to call back  

## 2021-07-14 NOTE — Telephone Encounter (Signed)
See My Chart correspondence from patient on message from 07/10/21. Per Dr. Tarri Glenn patient to be discharged from the practice for loss of therapeutic patient /physician relationship.  Letter generated and send to Medical records for processing.

## 2021-07-19 ENCOUNTER — Telehealth: Payer: Self-pay | Admitting: Gastroenterology

## 2021-07-19 NOTE — Telephone Encounter (Signed)
Patient dismissed from [facility] 07/14/21

## 2021-07-25 ENCOUNTER — Other Ambulatory Visit: Payer: Self-pay | Admitting: Sports Medicine

## 2021-07-25 NOTE — Telephone Encounter (Signed)
Please advise 

## 2021-07-29 ENCOUNTER — Encounter: Payer: Self-pay | Admitting: Sports Medicine

## 2021-07-29 NOTE — Telephone Encounter (Signed)
Routing to Barb Merino, RN, Psychiatrist and Dr. Tarri Glenn. Inquiring further to determine if Dr. Tarri Glenn may be removed from My Chart to eliminate pt ability to continue his inappropriate means of communication with Dr. Tarri Glenn.

## 2021-08-01 ENCOUNTER — Other Ambulatory Visit: Payer: Self-pay | Admitting: Sports Medicine

## 2021-08-01 MED ORDER — PREDNISONE 10 MG (21) PO TBPK: ORAL_TABLET | ORAL | 0 refills | Status: DC

## 2021-08-01 NOTE — Progress Notes (Signed)
Sent Prednisone pak to see if this will help with pain

## 2021-08-08 ENCOUNTER — Other Ambulatory Visit: Payer: Self-pay | Admitting: Sports Medicine

## 2021-08-08 ENCOUNTER — Ambulatory Visit: Payer: Medicaid Other | Admitting: Allergy and Immunology

## 2021-08-09 ENCOUNTER — Other Ambulatory Visit: Payer: Self-pay | Admitting: Sports Medicine

## 2021-08-09 DIAGNOSIS — M19071 Primary osteoarthritis, right ankle and foot: Secondary | ICD-10-CM

## 2021-08-09 DIAGNOSIS — G8929 Other chronic pain: Secondary | ICD-10-CM

## 2021-08-09 MED ORDER — MELOXICAM 15 MG PO TABS: 15.0000 mg | ORAL_TABLET | Freq: Every day | ORAL | 0 refills | Status: DC

## 2021-08-09 NOTE — Progress Notes (Signed)
Refilled meloxicam Referral replaced to [facility] for pain management

## 2021-08-11 NOTE — Telephone Encounter (Signed)
Please advise 

## 2021-08-12 ENCOUNTER — Other Ambulatory Visit: Payer: Self-pay

## 2021-08-12 ENCOUNTER — Ambulatory Visit
Admission: RE | Admit: 2021-08-12 | Discharge: 2021-08-12 | Disposition: A | Discharge status: Home / Self Care | Payer: Medicaid Other | Source: Ambulatory Visit | Attending: Sports Medicine | Admitting: Sports Medicine

## 2021-08-12 DIAGNOSIS — G8929 Other chronic pain: Secondary | ICD-10-CM

## 2021-08-15 ENCOUNTER — Encounter: Payer: Self-pay | Admitting: Podiatry

## 2021-08-15 ENCOUNTER — Telehealth: Payer: Self-pay | Admitting: Sports Medicine

## 2021-08-15 NOTE — Telephone Encounter (Signed)
Pt notified.  Will come by for cam boot.  Out of work note provided due to pt's job requires closed toe shoes.

## 2021-08-15 NOTE — Telephone Encounter (Signed)
Spoke with pt, msg sent to on call provider.

## 2021-08-15 NOTE — Telephone Encounter (Signed)
Dr. Cannon Kettle pt has virtual appt for MRI results 08-26-21. She is out of office this week. Pt states he has results and is saying he has torn ligament.  Pt took himself out of work last week due to pain.  He would like to know what he needs to do about work at this time.

## 2021-08-16 ENCOUNTER — Other Ambulatory Visit: Payer: Self-pay | Admitting: Sports Medicine

## 2021-08-17 MED ORDER — IBUPROFEN 800 MG PO TABS: 800.0000 mg | ORAL_TABLET | Freq: Four times a day (QID) | ORAL | 1 refills | Status: DC | PRN

## 2021-08-17 NOTE — Addendum Note (Signed)
Addended by: Boneta Lucks on: 08/17/2021 07:38 AM   Modules accepted: Orders

## 2021-08-18 NOTE — Telephone Encounter (Signed)
Called and spoke with the patient and patient stated that he has already picked up the ibuprofen and I stated that the ibuprofen was the only medicine he can take and not to take the celebrex or mobic at all and it would counter act with each other and not help with the pain and patient understood. Lattie Haw

## 2021-08-19 ENCOUNTER — Encounter: Payer: Self-pay | Admitting: Family Medicine

## 2021-08-19 ENCOUNTER — Ambulatory Visit (INDEPENDENT_AMBULATORY_CARE_PROVIDER_SITE_OTHER): Payer: Medicaid Other | Admitting: Family Medicine

## 2021-08-19 ENCOUNTER — Other Ambulatory Visit: Payer: Self-pay

## 2021-08-19 VITALS — BP 130/80 | HR 117 | Temp 99.6°F | Resp 16 | Ht 76.0 in | Wt 272.0 lb

## 2021-08-19 DIAGNOSIS — K200 Eosinophilic esophagitis: Secondary | ICD-10-CM

## 2021-08-19 DIAGNOSIS — J4540 Moderate persistent asthma, uncomplicated: Secondary | ICD-10-CM | POA: Diagnosis not present

## 2021-08-19 DIAGNOSIS — J4530 Mild persistent asthma, uncomplicated: Secondary | ICD-10-CM | POA: Insufficient documentation

## 2021-08-19 DIAGNOSIS — T7800XA Anaphylactic reaction due to unspecified food, initial encounter: Secondary | ICD-10-CM

## 2021-08-19 MED ORDER — BUDESONIDE-FORMOTEROL FUMARATE 160-4.5 MCG/ACT IN AERO: 2.0000 | INHALATION_SPRAY | Freq: Two times a day (BID) | RESPIRATORY_TRACT | 5 refills | Status: DC

## 2021-08-19 NOTE — Patient Instructions (Addendum)
°  1.  Allergen avoidance measures to pollen, cat, dog, and dust mites  2. Avoid peanuts egg, wheat, and tree nuts except almonds  3. Hold budesonide slurry for now. Follow up with Dr. Lyda Jester for management of EoE and rectal bleeding. Will submit for Dupixent for EoE. You will hear from our biological medication coordinator [name] with next steps.   4. Begin Symbicort 160-2 puffs twice a day with a spacer. This will replace Flovent 220 for now  5. If needed:   A. Albuterol HFA - 2 inhalations every 4-6 hours  B. Cetirizine 10 mg once a day for a runny nose  C. Benadryl 50 mg and EpiPen  D. Flonase 2 sprays in each nostril once a day  E. Nasal saline rinses  6. Return to clinic in 4 weeks or earlier if problem

## 2021-08-19 NOTE — Progress Notes (Addendum)
[name]  [id number] Dept: [phone]  FOLLOW UP NOTE  Patient ID: Bob Roy, male    DOB: 11/09/2000  Age: 21 y.o. MRN: [id number] Date of Office Visit: 08/19/2021  Assessment  Chief Complaint: Follow-up, Asthma (Not terrible), and Allergic Rhinitis  (Not terrible)  HPI BRENTLY VOORHIS is a 21 year old male who presents the clinic for follow-up visit.  He was last seen in this clinic on 11/01/2020 by Dr. Neldon Mc for evaluation of asthma, COVID-19 with pulmonary comorbidity, EOE, and food allergy.  At today's visit, he reports his asthma has not been well controlled over the last 3 months with symptoms including occasional shortness of breath with activity, occasional shortness of breath occurring in the evening, and occasional wheezing occurring in the daytime.  He denies cough.  He continues Flovent 220-2 puffs twice a day with a spacer and is not currently using albuterol.  He does report that he injured his ankle about 6 months ago which has decreased his mobility.  He does report a significant amount of weight gain during that time.  Allergic rhinitis is reported as moderately well controlled with the worst symptoms occurring in the springtime including nasal congestion, sneeze, and postnasal drainage.  He continues loratadine with moderate relief of symptoms.  He is using Flonase up to 3 times a day with moderate relief of symptoms.  He is not currently using a nasal saline rinse.  He does report 1 nosebleed over the last several months from both nostrils which resolved in under 5 minutes.  He continues to avoid peanuts and tree nuts with the exception of almonds which he is tolerating with no adverse reaction.  He continues to avoid egg and wheat.  He is occasionally ingesting milk and products containing milk with no adverse reactions.  He has recently started seeing Dr. Lyda Jester, GI specialist in [name] for management of rectal bleeding that began  around August as well as management of EOE.  He continues pantoprazole twice a day and famotidine twice a day.  He has continued budesonide slurry twice a day.  He does report that he has frequent episodes where he chokes on food.  He reports that he is always the first to finish eating at any gathering.  His current medications are listed in the chart. His last EGD was on 10/07/2020 with the results listed below:  1. Surgical [P], duodenitis - FOCAL ACTIVE DUODENITIS WITH FOCAL EROSION, NONSPECIFIC. - NO FEATURES OF CELIAC SPRUE OR GRANULOMAS. - DIFFERENTIAL INCLUDES MEDICATION RELATED DUODENITIS. 2. Surgical [P], fundus, gastric antrum and gastric body - ANTRAL MUCOSA WITH SLIGHT INFLAMMATION AND FEATURES OF REACTIVE GASTROPATHY. - OXYNTIC MUCOSA WITH HYPEREMIA. - WARTHIN-STARRY NEGATIVE FOR HELICOBACTER PYLORI. - NO INTESTINAL METAPLASIA, DYSPLASIA OR CARCINOMA. 3. Surgical [P], distal esophagus - GASTROESOPHAGEAL MUCOSA WITH MILD CHRONIC INFLAMMATION. - NO EOSINOPHILIC ESOPHAGITIS. 4. Surgical [P], mid esophagus and proximal esophageal - UNREMARKABLE SQUAMOUS MUCOSA. - NO EOSINOPHILIC ESOPHAGITIS (LESS THAN 2 PER HIGH POWER FIELD). [name] MD Pathologist, Electronic Signature (Case signed 10/07/2020)  Drug Allergies:  Allergies  Allergen Reactions   Clavulanic Acid    Gluten Meal    Misc. Sulfonamide Containing Compounds    Tilactase    Amoxicillin Rash   Augmentin [Amoxicillin-Pot Clavulanate] Rash   Elemental Sulfur Rash   Sulfa Antibiotics Rash    Physical Exam: BP 130/80    Pulse (!) 117    Temp 99.6 F (37.6 C) (Temporal)    Resp 16  Ht 6\' 4"  (1.93 m)    Wt 272 lb (123.4 kg)    SpO2 96%    BMI 33.11 kg/m    Physical Exam Vitals reviewed.  Constitutional:      Appearance: Normal appearance.  HENT:     Head: Normocephalic and atraumatic.     Right Ear: Tympanic membrane normal.     Left Ear: Tympanic membrane normal.     Nose:     Comments: Bilateral naris  slightly erythematous with clear nasal drainage noted.  No bleeding noted.  No scabs noted.  No lesions noted.  Pharynx slightly erythematous with cobblestone appearance.  No exudate.  Ears normal.  Eyes normal. Eyes:     Conjunctiva/sclera: Conjunctivae normal.  Cardiovascular:     Rate and Rhythm: Normal rate and regular rhythm.     Heart sounds: Normal heart sounds. No murmur heard. Pulmonary:     Effort: Pulmonary effort is normal.     Breath sounds: Normal breath sounds.     Comments: Lungs clear to auscultation Musculoskeletal:        General: Normal range of motion.     Cervical back: Normal range of motion and neck supple.  Skin:    General: Skin is warm and dry.  Neurological:     Mental Status: He is alert and oriented to person, place, and time.  Psychiatric:        Mood and Affect: Mood normal.        Behavior: Behavior normal.        Thought Content: Thought content normal.        Judgment: Judgment normal.    Diagnostics: FVC 5.71, FEV1 4.56.  Predicted FVC 6.76, predicted FEV1 5.60.  Spirometry indicates normal ventilatory function.  Postbronchodilator FVC 5.29, FEV1 4.54.  Postbronchodilator spirometry indicates no significant improvement.  Assessment and Plan: 1. Not well controlled moderate persistent asthma   2. Eosinophilic esophagitis   3. Allergy with anaphylaxis due to food     Meds ordered this encounter  Medications   budesonide-formoterol (SYMBICORT) 160-4.5 MCG/ACT inhaler    Sig: Inhale 2 puffs into the lungs 2 (two) times daily.    Dispense:  1 each    Refill:  5    Patient Instructions   1.  Allergen avoidance measures to pollen, cat, dog, and dust mites  2. Avoid peanuts egg, wheat, and tree nuts except almonds  3. Hold budesonide slurry for now. Follow up with Dr. Lyda Jester for management of EoE and rectal bleeding. Will submit for Dupixent for EoE. You will hear from our biological medication coordinator [name] with next steps.   4.  Begin Symbicort 160-2 puffs twice a day with a spacer. This will replace Flovent 220 for now  5. If needed:   A. Albuterol HFA - 2 inhalations every 4-6 hours  B. Cetirizine 10 mg once a day for a runny nose  C. Benadryl 50 mg and EpiPen  D. Flonase 2 sprays in each nostril once a day  E. Nasal saline rinses  6. Return to clinic in 4 weeks or earlier if problem  Return in about 4 weeks (around 09/16/2021), or if symptoms worsen or fail to improve.    Thank you for the opportunity to care for this patient.  Please do not hesitate to contact me with questions.  Gareth Morgan, FNP [facility]

## 2021-08-20 ENCOUNTER — Encounter: Payer: Self-pay | Admitting: Family Medicine

## 2021-08-22 ENCOUNTER — Telehealth: Payer: Self-pay

## 2021-08-22 MED ORDER — CETIRIZINE HCL 10 MG PO TABS: 10.0000 mg | ORAL_TABLET | Freq: Every day | ORAL | 1 refills | Status: DC | PRN

## 2021-08-22 NOTE — Telephone Encounter (Addendum)
I have spoke with patient via mychart message today and went ahead and sent Anne's note via mychart. Patient verbalized understanding.

## 2021-08-22 NOTE — Telephone Encounter (Signed)
-----   Message from Julius Bowels, [place] sent at 08/22/2021  3:52 PM EST ----- Called and left a message for patient to call our office back to review the message per Webb Silversmith.  ----- Message ----- From: Dara Hoyer, FNP Sent: 08/22/2021  11:53 AM EST To: [facility]  In reviewing this patient's chart, I do not see an elevated eosinophil count over the last year that would qualify him for an asthma biologic therapy (we had discussed Dupixent at the visit but without eos count elevated insurance will not cover). Can you please have him continue Symbicort 160-2 puffs twice a day and follow up with Dr. Neldon Mc in one month for further evaluation of his asthma? Dr. Raliegh Ip may want to draw labs at that point to look for increased eos if the patient's asthma is not well controlled. Thank you

## 2021-08-22 NOTE — Addendum Note (Signed)
Addended by: Dara Hoyer on: 08/22/2021 11:48 AM   Modules accepted: Orders

## 2021-08-23 ENCOUNTER — Telehealth: Payer: Self-pay | Admitting: *Deleted

## 2021-08-23 ENCOUNTER — Encounter: Payer: Self-pay | Admitting: Sports Medicine

## 2021-08-23 ENCOUNTER — Telehealth: Payer: Medicaid Other | Admitting: Sports Medicine

## 2021-08-23 ENCOUNTER — Ambulatory Visit: Payer: Medicaid Other | Admitting: Sports Medicine

## 2021-08-23 DIAGNOSIS — S96911S Strain of unspecified muscle and tendon at ankle and foot level, right foot, sequela: Secondary | ICD-10-CM | POA: Diagnosis not present

## 2021-08-23 DIAGNOSIS — M25571 Pain in right ankle and joints of right foot: Secondary | ICD-10-CM

## 2021-08-23 DIAGNOSIS — L905 Scar conditions and fibrosis of skin: Secondary | ICD-10-CM

## 2021-08-23 DIAGNOSIS — M19071 Primary osteoarthritis, right ankle and foot: Secondary | ICD-10-CM | POA: Diagnosis not present

## 2021-08-23 DIAGNOSIS — G8929 Other chronic pain: Secondary | ICD-10-CM

## 2021-08-23 NOTE — Telephone Encounter (Signed)
Called patient and advised approval and submit for Dupixent to [facility]. Gave instrux on delivery, storage and call for appt to get initial injecton in clinic with admin instrux

## 2021-08-23 NOTE — Telephone Encounter (Signed)
-----   Message from Dara Hoyer, FNP sent at 08/22/2021 11:48 AM EST ----- Hi there Michaeal Davis,

## 2021-08-23 NOTE — Progress Notes (Signed)
Subjective: Bob Roy is a 21 y.o. male patient seen today in office for follow-up evaluation right foot/ankle pain at area of previous surgery.  Patient reports that pain is about the same slightly better since he is out of work and is using boot but is concerned that the pain is unbearable states that even with the boot he has pain.  Patient is also here for discussion of MRI results.  Patient also has brought disability paperwork for me to complete to keep him out of work longer due to the pain in his right foot and ankle.  Patient Active Problem List   Diagnosis Date Noted   Asthma, well controlled, mild persistent 08/19/2021   Eosinophilic esophagitis 08/19/2021   Allergy with anaphylaxis due to food 08/19/2021   Muscle strain 02/02/2021   Contusion of right hand 05/05/2020   Right wrist pain 09/19/2019   Lumbar back pain 09/10/2019   Displaced fracture of right radial styloid process, subsequent encounter for closed fracture with nonunion 07/10/2019   Traumatic tear of triangular fibrocartilage complex of right wrist 07/10/2019   Umbilical pain 06/20/2018   Postoperative examination 02/20/2018   Ventral hernia without obstruction or gangrene 02/06/2018   Myofascial pain 04/25/2016   Asthma 03/23/2015   AR (allergic rhinitis) 03/23/2015   LPRD (laryngopharyngeal reflux disease) 03/23/2015   Other secondary hypertension 01/14/2015   Postconcussion syndrome 01/14/2015   Migraine without aura and without status migrainosus, not intractable 01/14/2015   Chronic daily headache 01/14/2015   Medication overuse headache 01/14/2015   Patellar instability of left knee 12/25/2014   Left anterior knee pain 12/16/2014    Current Outpatient Medications on File Prior to Visit  Medication Sig Dispense Refill   Acetaminophen-Codeine 300-30 MG tablet TAKE 1 TABLET BY MOUTH EVERY 8 HOURS FOR 7 DAYS 21 tablet 0   albuterol (PROAIR HFA) 108 (90 Base) MCG/ACT inhaler Can inhale two  puffs every four to six hours as needed for cough or wheeze. 8.5 each 0   azithromycin (ZITHROMAX) 250 MG tablet Take by mouth.     benzonatate (TESSALON) 100 MG capsule Take 100 mg by mouth 3 (three) times daily as needed. (Patient not taking: Reported on 08/19/2021)     BINAXNOW COVID-19 AG HOME TEST KIT See admin instructions. (Patient not taking: Reported on 08/19/2021)     budesonide (PULMICORT) 0.5 MG/2ML nebulizer solution Inhale into the lungs.     budesonide-formoterol (SYMBICORT) 160-4.5 MCG/ACT inhaler Inhale 2 puffs into the lungs 2 (two) times daily. 1 each 5   cefdinir (OMNICEF) 300 MG capsule Take 300 mg by mouth 2 (two) times daily. (Patient not taking: Reported on 08/19/2021)     celecoxib (CELEBREX) 200 MG capsule TAKE 1 CAPSULE BY MOUTH TWICE A DAY 30 capsule 1   cetirizine (ZYRTEC) 10 MG tablet Take 1 tablet (10 mg total) by mouth daily as needed for allergies. 30 tablet 1   ciprofloxacin (CIPRO) 250 MG tablet Take 500 mg by mouth 2 (two) times daily. (Patient not taking: Reported on 08/19/2021)     ciprofloxacin (CIPRO) 500 MG tablet Take by mouth. (Patient not taking: Reported on 08/19/2021)     cyclobenzaprine (FLEXERIL) 5 MG tablet Take 5 mg by mouth 3 (three) times daily.     EPIPEN 2-PAK 0.3 MG/0.3ML SOAJ injection Use as directed for life threatening allergic reactions 4 each 3   EQ PAIN RELIEVER 500 MG tablet TAKE 1 TABLET BY MOUTH EVERY 6 HOURS AS NEEDED 30 tablet 0  famotidine (PEPCID) 20 MG tablet Take 1 tablet (20 mg total) by mouth daily. 90 tablet 0   FLECTOR 1.3 % PTCH USE 1 PATCH TOPICALLY TWICE DAILY 30 patch 2   FLOVENT HFA 220 MCG/ACT inhaler INHALE 2 PUFFS INTO THE LUNGS 2 (TWO) TIMES DAILY. ADMINISTER USING METERED DOSE INHALER WITHOUT SPACER X 8 WEEKS 36 each 4   fluticasone (FLONASE) 50 MCG/ACT nasal spray USE 1-2 SPRAYS IN EACH NOSTRIL DAILY 16 mL 11   gabapentin (NEURONTIN) 300 MG capsule Take 1 capsule by mouth 3 (three) times daily.     ibuprofen (ADVIL)  800 MG tablet TAKE 1 TABLET BY MOUTH EVERY 8 HOURS AS NEEDED 90 tablet 0   ketorolac (TORADOL) 10 MG tablet Take 10 mg by mouth every 6 (six) hours as needed. (Patient not taking: Reported on 08/19/2021)     levofloxacin (LEVAQUIN) 750 MG tablet Take 750 mg by mouth daily. (Patient not taking: Reported on 08/19/2021)     meclizine (ANTIVERT) 25 MG tablet Take by mouth. (Patient not taking: Reported on 08/19/2021)     meloxicam (MOBIC) 15 MG tablet Take 1 tablet (15 mg total) by mouth daily. (Patient not taking: Reported on 08/19/2021) 30 tablet 0   methocarbamol (ROBAXIN) 500 MG tablet Take 1,000 mg by mouth 3 (three) times daily. (Patient not taking: Reported on 08/19/2021)     mirtazapine (REMERON SOL-TAB) 15 MG disintegrating tablet Take 15 mg by mouth at bedtime. (Patient not taking: Reported on 08/19/2021)     mupirocin ointment (BACTROBAN) 2 % Apply topically 2 (two) times daily. (Patient not taking: Reported on 08/19/2021)     omeprazole (PRILOSEC) 40 MG capsule Take 1 capsule by mouth daily. (Patient not taking: Reported on 08/19/2021)     ondansetron (ZOFRAN) 4 MG tablet TAKE 1 TO 2 TABLETS BY MOUTH EVERY 8 HOURS AS NEEDED FOR VOMITING (Patient not taking: Reported on 08/19/2021)     oxyCODONE (OXY IR/ROXICODONE) 5 MG immediate release tablet  (Patient not taking: Reported on 08/19/2021)     pantoprazole (PROTONIX) 40 MG tablet Take 1 tablet (40 mg total) by mouth 2 (two) times daily. (Patient not taking: Reported on 08/19/2021) 180 tablet 0   phenazopyridine (PYRIDIUM) 200 MG tablet Take 200 mg by mouth 3 (three) times daily. (Patient not taking: Reported on 08/19/2021)     predniSONE (STERAPRED UNI-PAK 21 TAB) 10 MG (21) TBPK tablet Take as directed (Patient not taking: Reported on 08/19/2021) 21 tablet 0   PULMICORT 0.5 MG/2ML nebulizer solution MIX ONE AMPULE WITH 10 SPLENDA PACKETS AND SWALLOW TWICE DAILY AS DIRECTED. 360 mL 1   Spacer/Aero-Holding Chambers DEVI Use with inhaler as directed. 1 each 1    tamsulosin (FLOMAX) 0.4 MG CAPS capsule Take 0.4 mg by mouth at bedtime.     traZODone (DESYREL) 100 MG tablet take 1/2 to 1 qhs prn sleep (Patient not taking: Reported on 08/19/2021)     venlafaxine XR (EFFEXOR-XR) 150 MG 24 hr capsule Take by mouth.     No current facility-administered medications on file prior to visit.    Allergies  Allergen Reactions   Clavulanic Acid    Gluten Meal    Misc. Sulfonamide Containing Compounds    Tilactase    Amoxicillin Rash   Augmentin [Amoxicillin-Pot Clavulanate] Rash   Elemental Sulfur Rash   Sulfa Antibiotics Rash    Objective: There were no vitals filed for this visit.  General: No acute distress, AAOx3  Derm: Incision well-healed with mild scar noted over  the dorsal lateral ankle no significant soft tissue swelling bruising erythema edema or warmth to the affected area at the right dorsal lateral foot or ankle Neurovascular: Gross sensation present via light touch to right foot and ankle DP and PT pedal pulses are palpable Orthopedic: There is pain to palpation to the dorsal lateral foot and ankle bilateral pain is worse on the right over the lateral ankle ligaments and sinus tarsi area with very minimal hypertrophic scar at the right dorsal lateral foot and ankle.  MRI suggests chronic changes nothing acute and changes consistent with arthritis and attenuation with partial tear at the ATFL that is chronic in nature  Assessment and Plan:  Problem List Items Addressed This Visit   None Visit Diagnoses     Chronic pain of right ankle    -  Primary   Relevant Orders   Ambulatory referral to Orthopedic Surgery   Tear of tendon of right ankle, sequela       Arthritis of right foot       Scar            -Patient seen and evaluated -Discussed with patient MRI findings advised patient since changes are chronic and he did not get any long-term relief from his initial ankle arthrotomy when he was initially complaining of ankle pain  without tear at that time I do not think that he would do well with repeat surgery I explained to patient that likely with a repeat surgery he may develop more scar and pain and there is no guarantee on the long-term result.  I am referring patient to orthopedics for a second opinion.  Patient has several questions if an arthroscopic approach would give him benefits and I advised patient that he will be better suited being treated by an orthopedic doctor who does more arthroscopic surgeries however I am not comfortable repeating surgery since he did not get better from previous surgery over 1 year ago -Advised patient meanwhile to continue with cam boot -Meanwhile advised patient to continue with ibuprofen for pain and patient reports that even the tramadol does not help anymore with the pain since patient has a history of chronic pain issues and has previously been seeing at [facility] recovery I am referring him to pain management at [facility] patient was previously referred to [facility] health physical medicine and rehab however they declined to take on patient stating that they could not accept him at this time and had no other additional recommendations for his care -Discussed treatment options for chronic right ankle pain and a new pain secondary to ankle sprain that happened 3 days ago -Advised patient to continue with wrap to the right ankle and brace to the left ankle -Patient to return to office as needed or after evaluation pain management and orthopedics -Medical leave paperwork completed for patient to continue out of work maximum of 2 more months since he is not able to work in the cam boot and to help to prevent further pain and disability.   Landis Martins, DPM

## 2021-08-26 ENCOUNTER — Telehealth: Payer: Medicaid Other | Admitting: Sports Medicine

## 2021-08-29 ENCOUNTER — Ambulatory Visit: Payer: Medicaid Other | Admitting: Orthopedic Surgery

## 2021-08-31 ENCOUNTER — Other Ambulatory Visit: Payer: Self-pay | Admitting: Sports Medicine

## 2021-08-31 NOTE — Telephone Encounter (Signed)
Please advise 

## 2021-09-02 ENCOUNTER — Telehealth: Payer: Self-pay | Admitting: *Deleted

## 2021-09-02 NOTE — Telephone Encounter (Signed)
-----   Message from Landis Martins, [place] sent at 09/01/2021  5:45 PM EST ----- ?He can have the patches ?----- Message ----- ?From: Viviana Simpler, PMAC ?Sent: 09/01/2021   5:18 PM EST ?To: Landis Martins, DPM ? ?[facility] called and left a message stating that he got the flector patch and has 4 NSAIDS and do we do the patches or the nsaids. Lattie Haw ? ? ?

## 2021-09-02 NOTE — Telephone Encounter (Signed)
Called [facility] and spoke with the pharmacist (fansene?) and stated that per Dr Cannon Kettle the patient can have the patches and not to take the NSAIDS. Lattie Haw ?

## 2021-09-05 ENCOUNTER — Ambulatory Visit (INDEPENDENT_AMBULATORY_CARE_PROVIDER_SITE_OTHER): Payer: Medicaid Other | Admitting: Orthopedic Surgery

## 2021-09-05 ENCOUNTER — Other Ambulatory Visit: Payer: Self-pay | Admitting: Sports Medicine

## 2021-09-05 ENCOUNTER — Encounter: Payer: Self-pay | Admitting: Orthopedic Surgery

## 2021-09-05 VITALS — Ht 76.0 in | Wt 270.0 lb

## 2021-09-05 DIAGNOSIS — M25571 Pain in right ankle and joints of right foot: Secondary | ICD-10-CM

## 2021-09-06 ENCOUNTER — Encounter: Payer: Self-pay | Admitting: Orthopedic Surgery

## 2021-09-06 NOTE — Progress Notes (Signed)
? ?Office Visit Note ?  ?Patient: Bob Roy           ?Date of Birth: 2001-01-24           ?MRN: [id number] ?Visit Date: 09/05/2021 ?             ?Requested by: Landis Martins, DPM ?[address] ?[address] ?[place],  [place] [address] ?PCP: Nicoletta Dress, MD ? ?Chief Complaint  ?Patient presents with  ? Right Ankle - Pain  ? ? ? ? ?HPI: ?Patient is a 21 year old gentleman who is seen for evaluation for chronic right ankle pain.  Patient is status post right ankle arthrotomy and excision of a medial exostosis approximately a year ago.  Patient recently has had an MRI scan and still has persistent pain over the surgical incision.  Patient states he has pain in his ankle all the time with instability he complains of increasing swelling with increased activities. ? ?Assessment & Plan: ?Visit Diagnoses:  ?1. Pain in right ankle and joints of right foot   ? ? ?Plan: Patient's symptoms seem to be due to scar tissue more so than ligamentous laxity or tendon insufficiency.  We will get a copy of the operative note and follow-up to discuss treatment options. ? ? ? ?Follow-Up Instructions: Return if symptoms worsen or fail to improve.  ? ?Ortho Exam ? ?Patient is alert, oriented, no adenopathy, well-dressed, normal affect, normal respiratory effort. ?Examination patient has a good dorsalis pedis pulse with an anterior drawer he has equal laxity of the anterior talofibular ligament bilaterally there is no increased laxity on the right.  There is no swelling or tenderness to palpation over the peroneal tendons.  Motor strength is 5/5 with eversion.  Patient has pain directly over the surgical incision.  Patient states he has not completed any physical therapy since insurance will only pay for 1 visit.  Review of the operative note shows that he had a anterior lateral incision for an open arthrotomy of the ankle.  Patient also had a ostectomy of the bony spur at the talonavicular joint.  The talonavicular joint is  asymptomatic.  Patient's tibiotalar joint is nontender to palpation the medial lateral gutters are nontender to palpation. ? ?Review of the MRI scan shows intact peroneal tendons.  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 ? ?Imaging: ?No results found. ?No images are attached to the encounter. ? ?Labs: ?Lab Results  ?Component Value Date  ? ESRSEDRATE 12 09/21/2020  ? CRP 3 09/21/2020  ? LABURIC 5.2 09/21/2020  ? ? ? ?Lab Results  ?Component Value Date  ? ALBUMIN 4.6 06/15/2020  ? ? ?No results found for: MG ?No results found for: VD25OH ? ?No results found for: PREALBUMIN ?CBC EXTENDED Latest Ref Rng & Units 09/28/2020 09/21/2020 06/15/2020  ?WBC 3.4 - 10.8 x10E3/uL 6.9 9.0 7.6  ?RBC 4.14 - 5.80 x10E6/uL 4.42 4.58 4.75  ?HGB 13.0 - 17.7 g/dL 13.7 14.3 14.8  ?HCT 37.5 - 51.0 % 40.8 42.1 43.4  ?PLT 150 - 450 x10E3/uL 311 276 212.0  ?NEUTROABS 1.4 - 7.0 x10E3/uL 4.1 6.9 -  ?LYMPHSABS 0.7 - 3.1 x10E3/uL 1.6 1.5 -  ? ? ? ?Body mass index is 32.87 kg/m?. ? ?Orders:  ?No orders of the defined types were placed in this encounter. ? ?No orders of the defined types were placed in this encounter. ? ? ? Procedures: ?No procedures performed ? ?Clinical Data: ?No additional findings. ? ?  ROS: ? ?All other systems negative, except as noted in the HPI. ?Review of Systems ? ?Objective: ?Vital Signs: Ht '6\' 4"'$  (1.93 m)   Wt 270 lb (122.5 kg)   BMI 32.87 kg/m?  ? ?Specialty Comments:  ?No specialty comments available. ? ?PMFS History: ?Patient Active Problem List  ? Diagnosis Date Noted  ? Asthma, well controlled, mild persistent 08/19/2021  ? Eosinophilic esophagitis 19/14/7829  ? Allergy with anaphylaxis due to food 08/19/2021  ? Muscle strain 02/02/2021  ? Contusion of right hand 05/05/2020  ? Right wrist pain 09/19/2019  ? Lumbar back pain 09/10/2019  ? Displaced fracture of right radial styloid process, subsequent encounter for closed fracture with nonunion 07/10/2019  ? Traumatic tear of  triangular fibrocartilage complex of right wrist 07/10/2019  ? Umbilical pain 56/21/3086  ? Postoperative examination 02/20/2018  ? Ventral hernia without obstruction or gangrene 02/06/2018  ? Myofascial pain 04/25/2016  ? Asthma 03/23/2015  ? AR (allergic rhinitis) 03/23/2015  ? LPRD (laryngopharyngeal reflux disease) 03/23/2015  ? Other secondary hypertension 01/14/2015  ? Postconcussion syndrome 01/14/2015  ? Migraine without aura and without status migrainosus, not intractable 01/14/2015  ? Chronic daily headache 01/14/2015  ? Medication overuse headache 01/14/2015  ? Patellar instability of left knee 12/25/2014  ? Left anterior knee pain 12/16/2014  ? ?Past Medical History:  ?Diagnosis Date  ? Anxiety   ? Arthritis   ? Asthma   ? Depression   ? Enlarged prostate   ? GERD (gastroesophageal reflux disease)   ? HTN (hypertension)   ? Obesity   ? Seasonal allergies   ?  ?Family History  ?Problem Relation Age of Onset  ? Migraines Mother   ?     Resolved in adulthood  ? Depression Mother   ? Anxiety disorder Mother   ? Breast cancer Mother   ?     mets to liver  ? Clotting disorder Mother   ? Asthma Mother   ? Cancer Father   ? Diabetes Father   ? Irritable bowel syndrome Father   ? Breast cancer Maternal Grandmother   ? Ulcerative colitis Maternal Grandmother   ? Cancer Paternal Grandmother   ?     type unknown  ? Prostate cancer Paternal Grandfather   ? Kidney disease Paternal Grandfather   ? Epilepsy Cousin   ? Colon cancer Neg Hx   ? Esophageal cancer Neg Hx   ? Stomach cancer Neg Hx   ? Rectal cancer Neg Hx   ?  ?Past Surgical History:  ?Procedure Laterality Date  ? ANKLE SURGERY Right   ? CIRCUMCISION    ? KNEE ARTHROSCOPY Left   ? NASAL SEPTUM SURGERY Bilateral 02/23/2021  ? Radial fracture surgery Right   ? VENTRAL HERNIA REPAIR    ? WRIST ARTHROSCOPY Right   ? ?Social History  ? ?Occupational History  ? Occupation: student  ?Tobacco Use  ? Smoking status: Former  ?  Types: Cigarettes  ?  Quit date: 2020  ?   Years since quitting: 3.1  ? Smokeless tobacco: Former  ?  Types: Chew  ?  Quit date: 2021  ?Vaping Use  ? Vaping Use: Never used  ?Substance and Sexual Activity  ? Alcohol use: Not Currently  ?  Comment: occasional  ? Drug use: No  ? Sexual activity: Never  ?  Birth control/protection: Abstinence  ? ? ? ? ? ?

## 2021-09-08 ENCOUNTER — Ambulatory Visit (INDEPENDENT_AMBULATORY_CARE_PROVIDER_SITE_OTHER): Payer: Medicaid Other | Admitting: Orthopedic Surgery

## 2021-09-08 ENCOUNTER — Telehealth: Payer: Self-pay | Admitting: Allergy and Immunology

## 2021-09-08 DIAGNOSIS — M25571 Pain in right ankle and joints of right foot: Secondary | ICD-10-CM

## 2021-09-08 NOTE — Telephone Encounter (Signed)
Patient came into office today to drop off forms that need to be filled out by Dr. Neldon Mc. I informed patient we take about 2-3 business days to complete if Dr. Neldon Mc is able to fill them out. Patient states he just received the forms today and are due tomorrow, 09/08/21.  ? ?I spoke with Dr. Neldon Mc and he requested we see patient in office before filling them out. I contacted patient and informed him of this. Patient understood and will come in for his OV on 09/19/21. ?

## 2021-09-11 ENCOUNTER — Encounter: Payer: Self-pay | Admitting: Orthopedic Surgery

## 2021-09-11 NOTE — Progress Notes (Signed)
? ?Office Visit Note ?  ?Patient: Bob Roy           ?Date of Birth: 12/15/2000           ?MRN: [id number] ?Visit Date: 09/08/2021 ?             ?Requested by: Nicoletta Dress, MD ?[address] ?[place],  [place] [address] ?PCP: Nicoletta Dress, MD ? ?Chief Complaint  ?Patient presents with  ? Right Ankle - Follow-up  ? ? ? ? ?HPI: ?Patient is a 21 year old gentleman who presents in follow-up for right ankle pain with complex medical symptoms. ? ?Assessment & Plan: ?Visit Diagnoses:  ?1. Pain in right ankle and joints of right foot   ? ? ?Plan: Discussed with patient his symptoms seem to be more consistent with a complex regional pain syndrome.  I do not feel that arthroscopic intervention would be helpful since he did have an open arthrotomy.  Patient's anterior drawer is stable I do not think that reconstruction of the anterior talofibular ligament would be helpful.  Discussed that surgical intervention may exacerbate his symptoms.  Patient does have an upcoming appointment with pain management on Monday and recommended the possibility of a sympathetic block or ketamine to help with the complex regional pain symptoms. ? ?Follow-Up Instructions: Return if symptoms worsen or fail to improve.  ? ?Ortho Exam ? ?Patient is alert, oriented, no adenopathy, well-dressed, normal affect, normal respiratory effort. ?I reviewed patient's operative note which shows a open ankle arthrotomy with debridement.  There were no osteochondral defects there was synovitis consistent with impingement. ? ?Examination patient does not have hypersensitivity to light touch does not have changes in temperature.  Patient states the pain over the surgical incision is 10/10.  An anterior drawer of the right ankle is more stable than the anterior drawer of the left ankle.  Patient has minimal pain to palpation over the tibial talar joint pain primarily over the surgical incision.  Patient is currently on gabapentin  300 mg 3 times a day he is also on Effexor and tramadol.  Discussed that for the complex regional pain I usually use the gabapentin  and an SSRI.  Patient is currently on the gabapentin and an SNRI.  There are no signs of infection or cellulitis. ? ?Imaging: ?No results found. ?No images are attached to the encounter. ? ?Labs: ?Lab Results  ?Component Value Date  ? ESRSEDRATE 12 09/21/2020  ? CRP 3 09/21/2020  ? LABURIC 5.2 09/21/2020  ? ? ? ?Lab Results  ?Component Value Date  ? ALBUMIN 4.6 06/15/2020  ? ? ?No results found for: MG ?No results found for: VD25OH ? ?No results found for: PREALBUMIN ?CBC EXTENDED Latest Ref Rng & Units 09/28/2020 09/21/2020 06/15/2020  ?WBC 3.4 - 10.8 x10E3/uL 6.9 9.0 7.6  ?RBC 4.14 - 5.80 x10E6/uL 4.42 4.58 4.75  ?HGB 13.0 - 17.7 g/dL 13.7 14.3 14.8  ?HCT 37.5 - 51.0 % 40.8 42.1 43.4  ?PLT 150 - 450 x10E3/uL 311 276 212.0  ?NEUTROABS 1.4 - 7.0 x10E3/uL 4.1 6.9 -  ?LYMPHSABS 0.7 - 3.1 x10E3/uL 1.6 1.5 -  ? ? ? ?There is no height or weight on file to calculate BMI. ? ?Orders:  ?No orders of the defined types were placed in this encounter. ? ?No orders of the defined types were placed in this encounter. ? ? ? Procedures: ?No procedures performed ? ?Clinical Data: ?No additional findings. ? ?ROS: ? ?All other systems negative,  except as noted in the HPI. ?Review of Systems ? ?Objective: ?Vital Signs: There were no vitals taken for this visit. ? ?Specialty Comments:  ?No specialty comments available. ? ?PMFS History: ?Patient Active Problem List  ? Diagnosis Date Noted  ? Asthma, well controlled, mild persistent 08/19/2021  ? Eosinophilic esophagitis 86/57/8469  ? Allergy with anaphylaxis due to food 08/19/2021  ? Muscle strain 02/02/2021  ? Contusion of right hand 05/05/2020  ? Right wrist pain 09/19/2019  ? Lumbar back pain 09/10/2019  ? Displaced fracture of right radial styloid process, subsequent encounter for closed fracture with nonunion 07/10/2019  ? Traumatic tear of triangular  fibrocartilage complex of right wrist 07/10/2019  ? Umbilical pain 62/95/2841  ? Postoperative examination 02/20/2018  ? Ventral hernia without obstruction or gangrene 02/06/2018  ? Myofascial pain 04/25/2016  ? Asthma 03/23/2015  ? AR (allergic rhinitis) 03/23/2015  ? LPRD (laryngopharyngeal reflux disease) 03/23/2015  ? Other secondary hypertension 01/14/2015  ? Postconcussion syndrome 01/14/2015  ? Migraine without aura and without status migrainosus, not intractable 01/14/2015  ? Chronic daily headache 01/14/2015  ? Medication overuse headache 01/14/2015  ? Patellar instability of left knee 12/25/2014  ? Left anterior knee pain 12/16/2014  ? ?Past Medical History:  ?Diagnosis Date  ? Anxiety   ? Arthritis   ? Asthma   ? Depression   ? Enlarged prostate   ? GERD (gastroesophageal reflux disease)   ? HTN (hypertension)   ? Obesity   ? Seasonal allergies   ?  ?Family History  ?Problem Relation Age of Onset  ? Migraines Mother   ?     Resolved in adulthood  ? Depression Mother   ? Anxiety disorder Mother   ? Breast cancer Mother   ?     mets to liver  ? Clotting disorder Mother   ? Asthma Mother   ? Cancer Father   ? Diabetes Father   ? Irritable bowel syndrome Father   ? Breast cancer Maternal Grandmother   ? Ulcerative colitis Maternal Grandmother   ? Cancer Paternal Grandmother   ?     type unknown  ? Prostate cancer Paternal Grandfather   ? Kidney disease Paternal Grandfather   ? Epilepsy Cousin   ? Colon cancer Neg Hx   ? Esophageal cancer Neg Hx   ? Stomach cancer Neg Hx   ? Rectal cancer Neg Hx   ?  ?Past Surgical History:  ?Procedure Laterality Date  ? ANKLE SURGERY Right   ? CIRCUMCISION    ? KNEE ARTHROSCOPY Left   ? NASAL SEPTUM SURGERY Bilateral 02/23/2021  ? Radial fracture surgery Right   ? VENTRAL HERNIA REPAIR    ? WRIST ARTHROSCOPY Right   ? ?Social History  ? ?Occupational History  ? Occupation: student  ?Tobacco Use  ? Smoking status: Former  ?  Types: Cigarettes  ?  Quit date: 2020  ?  Years  since quitting: 3.1  ? Smokeless tobacco: Former  ?  Types: Chew  ?  Quit date: 2021  ?Vaping Use  ? Vaping Use: Never used  ?Substance and Sexual Activity  ? Alcohol use: Not Currently  ?  Comment: occasional  ? Drug use: No  ? Sexual activity: Never  ?  Birth control/protection: Abstinence  ? ? ? ? ? ?

## 2021-09-12 ENCOUNTER — Encounter: Payer: Self-pay | Admitting: Sports Medicine

## 2021-09-14 ENCOUNTER — Other Ambulatory Visit: Payer: Self-pay | Admitting: Sports Medicine

## 2021-09-14 MED ORDER — DICLOFENAC EPOLAMINE 1.3 % EX PTCH: MEDICATED_PATCH | CUTANEOUS | 1 refills | Status: DC

## 2021-09-14 NOTE — Progress Notes (Signed)
Changed Rx for flector patch to a 90 day supply  ?

## 2021-09-19 ENCOUNTER — Encounter: Payer: Self-pay | Admitting: Allergy and Immunology

## 2021-09-19 ENCOUNTER — Other Ambulatory Visit: Payer: Self-pay

## 2021-09-19 ENCOUNTER — Ambulatory Visit: Payer: Medicaid Other | Admitting: Allergy and Immunology

## 2021-09-19 VITALS — BP 138/78 | HR 84 | Resp 18

## 2021-09-19 DIAGNOSIS — K200 Eosinophilic esophagitis: Secondary | ICD-10-CM | POA: Diagnosis not present

## 2021-09-19 DIAGNOSIS — T7800XA Anaphylactic reaction due to unspecified food, initial encounter: Secondary | ICD-10-CM

## 2021-09-19 DIAGNOSIS — K219 Gastro-esophageal reflux disease without esophagitis: Secondary | ICD-10-CM | POA: Diagnosis not present

## 2021-09-19 DIAGNOSIS — J4540 Moderate persistent asthma, uncomplicated: Secondary | ICD-10-CM | POA: Diagnosis not present

## 2021-09-19 DIAGNOSIS — J3089 Other allergic rhinitis: Secondary | ICD-10-CM

## 2021-09-19 MED ORDER — SYMBICORT 160-4.5 MCG/ACT IN AERO: INHALATION_SPRAY | RESPIRATORY_TRACT | 1 refills | Status: DC

## 2021-09-19 NOTE — Progress Notes (Signed)
? ?[name] ? ? ?Follow-up Note ? ?Referring Provider: Nicoletta Dress, MD ?Primary Provider: Nicoletta Dress, MD ?Date of Office Visit: 09/19/2021 ? ?Subjective:  ? ?Bob Roy (DOB: 05-27-2001) is a 21 y.o. male who returns to the [facility] on 09/19/2021 in re-evaluation of the following: ? 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  I last saw him in this clinic on 01 Nov 2020 when he visited with our nurse practitioner on 19 August 2021. ? ?We started him on dupilumab February 2023 and this has resulted in very good control of his swallowed steroid unresponsive eosinophilic esophagitis.  He has noticed much better ability to swallow and does not have as much "congestion" in his throat when he eats.  He continues to use pantoprazole twice a day and famotidine as well. ? ?His asthma issue is doing very well especially since he started dupilumab.  He has no coughing or wheezing and no need to use any short acting bronchodilator.  He still continues to use Symbicort. ? ?His nose is doing very well at this point in time while using Flonase. ? ?He remains away form consumption of eggs, peanuts, and tree nuts except for almonds. ? ?Allergies as of 09/19/2021   ? ?   Reactions  ? Clavulanic Acid   ? Gluten Meal   ? Misc. Sulfonamide Containing Compounds   ? Tilactase   ? Amoxicillin Rash  ? Augmentin [amoxicillin-pot Clavulanate] Rash  ? Elemental Sulfur Rash  ? Sulfa Antibiotics Rash  ? ?  ? ?  ?Medication List  ? ? ?albuterol 108 (90 Base) MCG/ACT inhaler ?Commonly known as: ProAir HFA ?Can inhale two puffs every four to six hours as needed for cough or wheeze. ?  ?celecoxib 200 MG capsule ?Commonly known as: CELEBREX ?TAKE 1 CAPSULE BY MOUTH TWICE A DAY ?  ?cetirizine 10 MG tablet ?Commonly known as:  ZYRTEC ?Take 1 tablet (10 mg total) by mouth daily as needed for allergies. ?  ?diclofenac 1.3 % Ptch ?Commonly known as: Flector ?USE 1 PATCH TOPICALLY TWICE DAILY ?  ?Dupixent 300 MG/2ML prefilled syringe ?Generic drug: dupilumab ?Inject into the skin once a week. ?  ?EpiPen 2-Pak 0.3 mg/0.3 mL Soaj injection ?Generic drug: EPINEPHrine ?Use as directed for life threatening allergic reactions ?  ?EQ Pain Reliever 500 MG tablet ?Generic drug: acetaminophen ?TAKE 1 TABLET BY MOUTH EVERY 6 HOURS AS NEEDED ?  ?famotidine 20 MG tablet ?Commonly known as: PEPCID ?Take 1 tablet (20 mg total) by mouth daily. ?  ?fluticasone 50 MCG/ACT nasal spray ?Commonly known as: FLONASE ?USE 1-2 SPRAYS IN EACH NOSTRIL DAILY ?  ?gabapentin 300 MG capsule ?Commonly known as: NEURONTIN ?Take 1 capsule by mouth 3 (three) times daily. ?  ?ibuprofen 800 MG tablet ?Commonly known as: ADVIL ?TAKE 1 TABLET BY MOUTH EVERY 8 HOURS AS NEEDED ?  ?meloxicam 15 MG tablet ?Commonly known as: MOBIC ?Take 1 tablet (15 mg total) by mouth daily. ?  ?mirtazapine 30 MG disintegrating tablet ?Commonly known as: REMERON SOL-TAB ?Take 30 mg by mouth at bedtime. ?  ?pantoprazole 40 MG tablet ?Commonly known as: PROTONIX ?Take 1 tablet (40 mg total) by mouth 2 (two) times daily. ?  ?Spacer/Aero-Holding Dorise Bullion ?Use with inhaler as directed. ?  ?Symbicort 160-4.5 MCG/ACT inhaler ?Generic drug: budesonide-formoterol ?Inhale two  puffs with spacer twice daily to prevent cough or wheeze.  Rinse, gargle, and spit after use. ?  ?tamsulosin 0.4 MG Caps capsule ?Commonly known as: FLOMAX ?Take 0.4 mg by mouth at bedtime. ?  ?traMADol 50 MG tablet ?Commonly known as: ULTRAM ?Take 50 mg by mouth 4 (four) times daily as needed. ?  ?venlafaxine XR 150 MG 24 hr capsule ?Commonly known as: EFFEXOR-XR ?Take by mouth. ?  ? ?Past Medical History:  ?Diagnosis Date  ? Anxiety   ? Arthritis   ? Asthma   ? Depression   ? Enlarged prostate   ? Eosinophilic esophagitis   ? GERD  (gastroesophageal reflux disease)   ? HTN (hypertension)   ? Obesity   ? Seasonal allergies   ? ? ?Past Surgical History:  ?Procedure Laterality Date  ? ANKLE SURGERY Right   ? CIRCUMCISION    ? KNEE ARTHROSCOPY Left   ? NASAL SEPTUM SURGERY Bilateral 02/23/2021  ? Radial fracture surgery Right   ? VENTRAL HERNIA REPAIR    ? WRIST ARTHROSCOPY Right   ? ? ?Review of systems negative except as noted in HPI / PMHx or noted below: ? ?Review of Systems  ?Constitutional: Negative.   ?HENT: Negative.    ?Eyes: Negative.   ?Respiratory: Negative.    ?Cardiovascular: Negative.   ?Gastrointestinal: Negative.   ?Genitourinary: Negative.   ?Musculoskeletal: Negative.   ?Skin: Negative.   ?Neurological: Negative.   ?Endo/Heme/Allergies: Negative.   ?Psychiatric/Behavioral: Negative.    ? ? ?Objective:  ? ?Vitals:  ? 09/19/21 1633  ?BP: 138/78  ?Pulse: 84  ?Resp: 18  ?SpO2: 97%  ? ?   ?   ? ?Physical Exam ?Constitutional:   ?   Appearance: He is not diaphoretic.  ?HENT:  ?   Head: Normocephalic.  ?   Right Ear: Tympanic membrane, ear canal and external ear normal.  ?   Left Ear: Tympanic membrane, ear canal and external ear normal.  ?   Nose: Nose normal. No mucosal edema or rhinorrhea.  ?   Mouth/Throat:  ?   Pharynx: Uvula midline. No oropharyngeal exudate.  ?Eyes:  ?   Conjunctiva/sclera: Conjunctivae normal.  ?Neck:  ?   Thyroid: No thyromegaly.  ?   Trachea: Trachea normal. No tracheal tenderness or tracheal deviation.  ?Cardiovascular:  ?   Rate and Rhythm: Normal rate and regular rhythm.  ?   Heart sounds: Normal heart sounds, S1 normal and S2 normal. No murmur heard. ?Pulmonary:  ?   Effort: No respiratory distress.  ?   Breath sounds: Normal breath sounds. No stridor. No wheezing or rales.  ?Lymphadenopathy:  ?   Head:  ?   Right side of head: No tonsillar adenopathy.  ?   Left side of head: No tonsillar adenopathy.  ?   Cervical: No cervical adenopathy.  ?Skin: ?   Findings: No erythema or rash.  ?   Nails: There is no  clubbing.  ?Neurological:  ?   Mental Status: He is alert.  ? ? ?Diagnostics:  ?  ?Spirometry was performed and demonstrated an FEV1 of 4.93 at 88 % of predicted. ? ?Assessment and Plan:  ? ?1. Eosinophilic esophagitis   ?2. Asthma, moderate persistent, well-controlled   ?3. Perennial allergic rhinitis   ?4. Allergy with anaphylaxis due to food   ?5. Gastroesophageal reflux disease, unspecified whether esophagitis present   ? ? ?1.  Allergen avoidance measures to pollen, cat, dog, and dust mites ? ?2. Avoid peanuts egg,  and tree nuts except almonds ? ?3.  Continue dupilumab injections every week ? ?4.  Continue Symbicort 160-2 puffs twice a day with a spacer.   ? ?5.  Continue Flonase 2 sprays in each nostril once a day ? ?6. Continue Protonix '40mg'$  -  2 times per day + famotidine 40 mg in evening ? ?7. If needed: ? ? A. Albuterol HFA - 2 inhalations every 4-6 hours ? B. Cetirizine 10 mg once a day for a runny nose ? C. EpiPen ? D. Nasal saline rinses ? ?8. Return to clinic in 12 weeks or earlier if problem.  Taper medications ??? ? ?Melvyn's initial response to dupilumab has been quite favorable regarding his eosinophilic esophagitis and is also on his airway issue.  We will keep him on this agent and all of his other anti-inflammatory agents for his airway and therapy directed against reflux for the next 12 weeks.  At that point in time he should have maximal effect from his dupilumab and we may be able to dramatically consolidate his other medical therapy.  I will see him back in this clinic in 12 weeks or earlier if there is a problem. ? ?Allena Katz, MD ?Allergy / Immunology ?[place] ?

## 2021-09-19 NOTE — Patient Instructions (Addendum)
?  1.  Allergen avoidance measures to pollen, cat, dog, and dust mites ? ?2. Avoid peanuts egg, and tree nuts except almonds ? ?3.  Continue dupilumab injections every week ? ?4.  Continue Symbicort 160-2 puffs twice a day with a spacer.   ? ?5.  Continue Flonase 2 sprays in each nostril once a day ? ?6. Continue Protonix '40mg'$  -  2 times per day + famotidine 40 mg in evening ? ?7. If needed: ? ? A. Albuterol HFA - 2 inhalations every 4-6 hours ? B. Cetirizine 10 mg once a day for a runny nose ? C. EpiPen ? D. Nasal saline rinses ? ?8. Return to clinic in 12 weeks or earlier if problem.  Taper medications ??? ? ? ?

## 2021-09-20 ENCOUNTER — Encounter: Payer: Self-pay | Admitting: Allergy and Immunology

## 2021-09-23 ENCOUNTER — Encounter: Payer: Self-pay | Admitting: Sports Medicine

## 2021-09-23 ENCOUNTER — Ambulatory Visit: Payer: Medicaid Other | Admitting: Sports Medicine

## 2021-09-23 ENCOUNTER — Other Ambulatory Visit: Payer: Self-pay

## 2021-09-23 DIAGNOSIS — S99912D Unspecified injury of left ankle, subsequent encounter: Secondary | ICD-10-CM

## 2021-09-23 DIAGNOSIS — M779 Enthesopathy, unspecified: Secondary | ICD-10-CM

## 2021-09-23 DIAGNOSIS — M7751 Other enthesopathy of right foot: Secondary | ICD-10-CM

## 2021-09-23 DIAGNOSIS — M25571 Pain in right ankle and joints of right foot: Secondary | ICD-10-CM

## 2021-09-23 DIAGNOSIS — L905 Scar conditions and fibrosis of skin: Secondary | ICD-10-CM

## 2021-09-23 DIAGNOSIS — G8929 Other chronic pain: Secondary | ICD-10-CM

## 2021-09-23 DIAGNOSIS — S99922D Unspecified injury of left foot, subsequent encounter: Secondary | ICD-10-CM

## 2021-09-23 MED ORDER — TRIAMCINOLONE ACETONIDE 10 MG/ML IJ SUSP: 10.0000 mg | Freq: Once | INTRAMUSCULAR | Status: DC
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
## 2021-09-27 NOTE — Addendum Note (Signed)
Addended by: Landis Martins T on: 09/27/2021 07:04 AM ? ? Modules accepted: Orders ? ?

## 2021-10-03 DIAGNOSIS — M79676 Pain in unspecified toe(s): Secondary | ICD-10-CM

## 2021-10-11 ENCOUNTER — Encounter: Payer: Self-pay | Admitting: Sports Medicine

## 2021-10-11 NOTE — Progress Notes (Signed)
[employer] permanent disability paperwork filled out on patient's behalf ?

## 2021-10-16 ENCOUNTER — Other Ambulatory Visit: Payer: Self-pay | Admitting: Podiatry

## 2021-10-16 ENCOUNTER — Other Ambulatory Visit: Payer: Self-pay | Admitting: Allergy and Immunology

## 2021-10-16 DIAGNOSIS — Z9889 Other specified postprocedural states: Secondary | ICD-10-CM

## 2021-10-17 ENCOUNTER — Other Ambulatory Visit: Payer: Self-pay | Admitting: *Deleted

## 2021-10-17 ENCOUNTER — Other Ambulatory Visit: Payer: Self-pay | Admitting: Podiatry

## 2021-10-17 DIAGNOSIS — Z9889 Other specified postprocedural states: Secondary | ICD-10-CM

## 2021-10-17 NOTE — Telephone Encounter (Signed)
Please advise 

## 2021-10-19 ENCOUNTER — Other Ambulatory Visit: Payer: Self-pay | Admitting: Podiatry

## 2021-10-19 DIAGNOSIS — Z9889 Other specified postprocedural states: Secondary | ICD-10-CM

## 2021-10-25 ENCOUNTER — Ambulatory Visit: Payer: Medicaid Other | Admitting: Sports Medicine

## 2021-10-26 ENCOUNTER — Other Ambulatory Visit: Payer: Self-pay | Admitting: Podiatry

## 2021-10-26 DIAGNOSIS — Z9889 Other specified postprocedural states: Secondary | ICD-10-CM

## 2021-10-27 NOTE — Telephone Encounter (Signed)
Please advise 

## 2021-11-14 ENCOUNTER — Telehealth: Payer: Self-pay | Admitting: Orthopedic Surgery

## 2021-11-14 NOTE — Telephone Encounter (Signed)
Called patient left message on voicemail to return call to schedule an appointment with Dr. Sharol Given per [facility] message for ankle instability   ?

## 2021-11-16 ENCOUNTER — Other Ambulatory Visit: Payer: Self-pay | Admitting: Sports Medicine

## 2021-11-17 NOTE — Telephone Encounter (Signed)
Please advise 

## 2021-11-22 ENCOUNTER — Other Ambulatory Visit: Payer: Self-pay | Admitting: Sports Medicine

## 2021-11-22 ENCOUNTER — Other Ambulatory Visit: Payer: Self-pay | Admitting: Podiatry

## 2021-11-22 DIAGNOSIS — Z9889 Other specified postprocedural states: Secondary | ICD-10-CM

## 2021-11-23 ENCOUNTER — Other Ambulatory Visit: Payer: Self-pay | Admitting: Sports Medicine

## 2021-11-23 ENCOUNTER — Other Ambulatory Visit: Payer: Self-pay | Admitting: Podiatry

## 2021-11-23 ENCOUNTER — Telehealth: Payer: Self-pay | Admitting: *Deleted

## 2021-11-23 DIAGNOSIS — Z9889 Other specified postprocedural states: Secondary | ICD-10-CM

## 2021-11-23 MED ORDER — DICLOFENAC EPOLAMINE 1.3 % EX PTCH: 1.0000 | MEDICATED_PATCH | Freq: Two times a day (BID) | CUTANEOUS | 1 refills | Status: DC

## 2021-11-23 NOTE — Progress Notes (Signed)
New rx for flector patch sent to increase qty from 3 to 90

## 2021-11-23 NOTE — Telephone Encounter (Signed)
Pharmacist from [facility] in [place] called and left a message stating that Dr Blenda Mounts sent in flector patches and the quantity was 3 and the directions are 1 patch twice daily and per Dr Cannon Kettle resent a new prescription for quantity 90 patches and the directions are 1 patch twice daily and has one refill. Lattie Haw

## 2021-11-23 NOTE — Telephone Encounter (Signed)
Please advise 

## 2021-11-25 ENCOUNTER — Other Ambulatory Visit: Payer: Self-pay | Admitting: Gastroenterology

## 2021-11-25 DIAGNOSIS — K219 Gastro-esophageal reflux disease without esophagitis: Secondary | ICD-10-CM

## 2021-11-25 DIAGNOSIS — K200 Eosinophilic esophagitis: Secondary | ICD-10-CM

## 2021-11-25 DIAGNOSIS — G8929 Other chronic pain: Secondary | ICD-10-CM

## 2021-11-29 ENCOUNTER — Other Ambulatory Visit: Payer: Self-pay | Admitting: Family Medicine

## 2021-12-01 ENCOUNTER — Telehealth: Payer: Self-pay

## 2021-12-01 NOTE — Telephone Encounter (Signed)
Hey yall,  I got a interesting call today from a company called [employer]. The man didn't speak good english, but I gathered that he is requesting records & itemized billing between the dates of 01/26/21 until 11/10/21. He stated he was going to send a fax over to our [place] office with the e-mail or mailing address for where the records can go.

## 2021-12-05 ENCOUNTER — Ambulatory Visit: Payer: Medicaid Other | Admitting: Podiatry

## 2021-12-05 ENCOUNTER — Encounter: Payer: Self-pay | Admitting: Podiatry

## 2021-12-05 DIAGNOSIS — S99922D Unspecified injury of left foot, subsequent encounter: Secondary | ICD-10-CM

## 2021-12-05 DIAGNOSIS — M7751 Other enthesopathy of right foot: Secondary | ICD-10-CM | POA: Diagnosis not present

## 2021-12-05 DIAGNOSIS — M25572 Pain in left ankle and joints of left foot: Secondary | ICD-10-CM

## 2021-12-05 DIAGNOSIS — M779 Enthesopathy, unspecified: Secondary | ICD-10-CM

## 2021-12-05 DIAGNOSIS — G8929 Other chronic pain: Secondary | ICD-10-CM

## 2021-12-05 DIAGNOSIS — M7752 Other enthesopathy of left foot: Secondary | ICD-10-CM

## 2021-12-05 MED ORDER — DEXAMETHASONE SODIUM PHOSPHATE 120 MG/30ML IJ SOLN
4.0000 mg | Freq: Once | INTRAMUSCULAR | Status: AC
  Administered 2021-12-05: 4 mg via INTRA_ARTICULAR

## 2021-12-05 MED ORDER — DICLOFENAC EPOLAMINE 1.3 % EX PTCH: 1.0000 | MEDICATED_PATCH | Freq: Two times a day (BID) | CUTANEOUS | 3 refills | Status: DC

## 2021-12-05 NOTE — Progress Notes (Signed)
Subjective: Bob Roy is a 21 y.o. male patient seen today in office for follow-up evaluation right foot/ankle pain at area of previous surgery and worsening pain in left ankle.  Relates he would like further work-up on the left as it is worsening. Relates he would also like bilateral injections today and refill of patches as that is the only thing that helps. . No other issues noted.   Patient Active Problem List   Diagnosis Date Noted   Asthma, well controlled, mild persistent 81/82/9937   Eosinophilic esophagitis 16/96/7893   Allergy with anaphylaxis due to food 08/19/2021   Muscle strain 02/02/2021   Contusion of right hand 05/05/2020   Right wrist pain 09/19/2019   Lumbar back pain 09/10/2019   Displaced fracture of right radial styloid process, subsequent encounter for closed fracture with nonunion 07/10/2019   Traumatic tear of triangular fibrocartilage complex of right wrist 81/07/7508   Umbilical pain 25/85/2778   Postoperative examination 02/20/2018   Ventral hernia without obstruction or gangrene 02/06/2018   Myofascial pain 04/25/2016   Asthma 03/23/2015   AR (allergic rhinitis) 03/23/2015   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 8.5 each 0   cetirizine (ZYRTEC) 10 MG tablet TAKE 1 TABLET BY MOUTH ONCE DAILY AS NEEDED FOR ALLERGIES 30 tablet 5   diclofenac (FLECTOR) 1.3 % PTCH Place 1 patch onto the skin 2 (two)  times daily. 90 patch 1   DUPIXENT 300 MG/2ML prefilled syringe Inject into the skin once a week.     EPIPEN 2-PAK 0.3 MG/0.3ML SOAJ injection Use as directed for life threatening allergic reactions 4 each 3   EQ PAIN RELIEVER 500 MG tablet TAKE 1 TABLET BY MOUTH EVERY 6 HOURS AS NEEDED 30 tablet 0   famotidine (PEPCID) 20 MG tablet Take 1 tablet (20 mg total) by mouth daily. 90 tablet 0   FLOVENT HFA 220 MCG/ACT inhaler INHALE 2 PUFFS INTO THE LUNGS 2 (TWO) TIMES DAILY. ADMINISTER USING METERED DOSE INHALER WITHOUT SPACER X 8 WEEKS (Patient not taking: Reported on 09/19/2021) 36 each 4   fluticasone (FLONASE) 50 MCG/ACT nasal spray USE 1 TO 2 SPRAY(S) IN EACH NOSTRIL ONCE DAILY 16 g 2   gabapentin (NEURONTIN) 300 MG capsule Take 1 capsule by mouth 3 (three) times daily.     ibuprofen (ADVIL) 800 MG tablet TAKE 1 TABLET BY MOUTH EVERY 6 HOURS AS NEEDED 60 tablet 0   mirtazapine (REMERON SOL-TAB) 30 MG disintegrating tablet Take 30 mg by mouth at bedtime.     pantoprazole (PROTONIX) 40 MG tablet Take 1 tablet (40 mg total) by mouth 2 (two) times daily. 180 tablet 0   PULMICORT 0.5 MG/2ML nebulizer solution MIX ONE AMPULE WITH 10 SPLENDA PACKETS AND SWALLOW TWICE DAILY AS DIRECTED. (Patient not taking: Reported on 09/19/2021) 360 mL 1   Spacer/Aero-Holding [facility] Use with inhaler as directed. 1 each 1   SYMBICORT 160-4.5 MCG/ACT inhaler Inhale two puffs with spacer twice daily to prevent  cough or wheeze.  Rinse, gargle, and spit after use. 30.6 g 1   tamsulosin (FLOMAX) 0.4 MG CAPS capsule Take 0.4 mg by mouth at bedtime.     traMADol (ULTRAM) 50 MG tablet Take 50 mg by mouth 4 (four) times daily as needed.     venlafaxine XR (EFFEXOR-XR) 150 MG 24 hr capsule Take by mouth.     Current Facility-Administered Medications on File Prior to Visit  Medication Dose Route Frequency Provider Last Rate Last Admin   triamcinolone acetonide (KENALOG) 10 MG/ML injection 10 mg  10 mg Other Once Landis Martins, DPM       triamcinolone acetonide (KENALOG) 10 MG/ML injection 10 mg  10 mg Other Once Landis Martins, DPM        Allergies  Allergen Reactions   Clavulanic Acid    Gluten Meal    Misc. Sulfonamide Containing Compounds    Tilactase    Amoxicillin Rash   Augmentin [Amoxicillin-Pot Clavulanate] Rash   Elemental Sulfur Rash   Sulfa Antibiotics Rash    Objective: There were no vitals filed for this visit.  General: No acute distress, AAOx3  Derm: Incision well-healed with mild scar noted over the dorsal lateral ankle no significant soft tissue swelling bruising erythema edema or warmth to the affected area at the right dorsal lateral foot or ankle Neurovascular: Gross sensation present via light touch to right foot and ankle DP and PT pedal pulses are palpable Orthopedic: There is pain to palpation to the dorsal lateral foot and ankle bilateral pain is worse on the right over the lateral ankle ligaments and sinus tarsi area with very minimal hypertrophic scar at the right dorsal lateral foot and ankle.There is also some tenderness at right heel at achilles insertion.   Assessment and Plan:  Problem List Items Addressed This Visit   None   -Patient seen and evaluated -Discussed with patient with chronic pain right foot/ankle -After oral consent and aseptic prep, injected a mixture containing 1 ml of 2% plain lidocaine, 1 ml 0.5% plain marcaine, and 0.5 ml of dexamethasone phosphate into right dorsal lateral foot at sinus tarsi as well as second injection for the left sinus tarsi. . This is a local injection to see if patient will get any relief. Post-injection care discussed with patient.  -Advised patient that he may always have pain of which I suspect is a chronic pain syndrome versus CRPS and that he should continue to follow-up as scheduled with pain management -Continue with Flector patch as directed. Refill provided.  -Left sinus tarsi area worsening. Will order MRI to  evaluate.  -Orthopedic notes reviewed  -Continue PT  -Return to office as needed or if symptoms fail to continue to improve.    Lorenda Peck , DPM

## 2021-12-14 ENCOUNTER — Encounter: Payer: Self-pay | Admitting: Allergy and Immunology

## 2021-12-14 ENCOUNTER — Ambulatory Visit: Payer: Medicaid Other | Admitting: Allergy and Immunology

## 2021-12-14 VITALS — BP 132/92 | HR 104 | Resp 16

## 2021-12-14 DIAGNOSIS — K200 Eosinophilic esophagitis: Secondary | ICD-10-CM | POA: Diagnosis not present

## 2021-12-14 DIAGNOSIS — K219 Gastro-esophageal reflux disease without esophagitis: Secondary | ICD-10-CM

## 2021-12-14 DIAGNOSIS — J3089 Other allergic rhinitis: Secondary | ICD-10-CM | POA: Diagnosis not present

## 2021-12-14 DIAGNOSIS — J4540 Moderate persistent asthma, uncomplicated: Secondary | ICD-10-CM | POA: Diagnosis not present

## 2021-12-14 DIAGNOSIS — T7800XA Anaphylactic reaction due to unspecified food, initial encounter: Secondary | ICD-10-CM

## 2021-12-14 MED ORDER — CETIRIZINE HCL 10 MG PO TABS: ORAL_TABLET | ORAL | 1 refills | Status: DC

## 2021-12-14 NOTE — Progress Notes (Signed)
[place] [facility] [place] [facility] [place]   Follow-up Note  Referring Provider: Nicoletta Dress, MD Primary Provider: Nicoletta Dress, MD Date of Office Visit: 12/14/2021  Subjective:   Bob Roy (DOB: March 11, 2001) is a 21 y.o. 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  His last visit to this clinic was 19 September 2021.  He has been using dupilumab now for 4 months and this has resulted in rather significant improvement regarding his eosinophilic esophagitis.  He still has some intermittent regurgitation about twice a week.  He still has a little bit of throat clearing and a cough sometimes while eating about twice a week.  He still has food that feels as though it sometimes gets hung up and he will have to gag and vomit but he has only done this twice in the past 4 months.  Overall, he is very satisfied with the response that he is receiving on his current therapy which includes dupilumab, pantoprazole twice a day, and famotidine.  He has had no issues with asthma has not had to use a short acting bronchodilator.    He states that he has had 2 sinus infections.  His sinus infection is manifested as pressure in his head and postnasal drip and apparently one of the sinus infections was associated with otitis media.  He was apparently treated with a Z-Pak during 1 of these events and levofloxacin during his most recent event.  He remains away from consumption of eggs, peanuts, and tree nuts except he can eat almonds with no problem.  Allergies as of 12/14/2021       Reactions   Clavulanic Acid    Gluten Meal    Misc. Sulfonamide Containing Compounds    Tilactase    Amoxicillin Rash   Augmentin [amoxicillin-pot Clavulanate] Rash    Elemental Sulfur Rash   Sulfa Antibiotics Rash        Medication List    albuterol 108 (90 Base) MCG/ACT inhaler Commonly known as: ProAir HFA Can inhale two puffs every four to six hours as needed for cough or wheeze.   amLODipine 5 MG tablet Commonly known as: NORVASC Take by mouth.   azithromycin 250 MG tablet Commonly known as: ZITHROMAX   benzonatate 100 MG capsule Commonly known as: TESSALON Take by mouth.   celecoxib 200 MG capsule Commonly known as: CELEBREX Take by mouth.   cetirizine 10 MG tablet Commonly known as: ZYRTEC TAKE 1 TABLET BY MOUTH ONCE DAILY AS NEEDED FOR ALLERGIES   ciclopirox 0.77 % cream Commonly known as: LOPROX Apply topically 2 (two) times daily.   cyclobenzaprine 5 MG tablet Commonly known as: FLEXERIL Take by mouth.   diclofenac 1.3 % Ptch Commonly known as: Flector Place 1 patch onto the skin 2 (two) times daily.   Dupixent 300 MG/2ML prefilled syringe Generic drug: dupilumab Inject into the skin once a week.   EpiPen 2-Pak 0.3 mg/0.3 mL Soaj injection Generic drug: EPINEPHrine Use as directed for life threatening allergic reactions   EQ Pain Reliever 500 MG tablet Generic drug: acetaminophen TAKE 1 TABLET BY MOUTH EVERY 6 HOURS AS NEEDED   famotidine 20 MG tablet Commonly known as: PEPCID Take 1 tablet (20 mg total) by mouth daily.   Flovent HFA  220 MCG/ACT inhaler Generic drug: fluticasone INHALE 2 PUFFS INTO THE LUNGS 2 (TWO) TIMES DAILY. ADMINISTER USING METERED DOSE INHALER WITHOUT SPACER X 8 WEEKS   fluticasone 50 MCG/ACT nasal spray Commonly known as: FLONASE USE 1 TO 2 SPRAY(S) IN EACH NOSTRIL ONCE DAILY   gabapentin 300 MG capsule Commonly known as: NEURONTIN Take 1 capsule by mouth 3 (three) times daily.   ibuprofen 600 MG tablet Commonly known as: ADVIL Take 600 mg by mouth every 6 (six) hours as needed.   ibuprofen 800 MG tablet Commonly known as: ADVIL TAKE 1 TABLET BY MOUTH EVERY 6 HOURS AS  NEEDED   levofloxacin 750 MG tablet Commonly known as: LEVAQUIN Take 750 mg by mouth daily.   metroNIDAZOLE 500 MG tablet Commonly known as: FLAGYL Take 500 mg by mouth 3 (three) times daily.   mirtazapine 30 MG disintegrating tablet Commonly known as: REMERON SOL-TAB Take 30 mg by mouth at bedtime.   mirtazapine 30 MG disintegrating tablet Commonly known as: REMERON SOL-TAB Take by mouth.   OLANZapine 5 MG tablet Commonly known as: ZYPREXA Take by mouth.   pantoprazole 40 MG tablet Commonly known as: PROTONIX Take 1 tablet by mouth 2 (two) times daily.   pantoprazole 40 MG tablet Commonly known as: PROTONIX Take 1 tablet (40 mg total) by mouth 2 (two) times daily.   phenazopyridine 200 MG tablet Commonly known as: PYRIDIUM Take 200 mg by mouth 3 (three) times daily as needed.   predniSONE 20 MG tablet Commonly known as: DELTASONE   predniSONE 10 MG tablet Commonly known as: DELTASONE Take by mouth.   Pulmicort 0.5 MG/2ML nebulizer solution Generic drug: budesonide MIX ONE AMPULE WITH 10 SPLENDA PACKETS AND SWALLOW TWICE DAILY AS DIRECTED.   Spacer/Aero-Holding [employer] Use with inhaler as directed.   Symbicort 160-4.5 MCG/ACT inhaler Generic drug: budesonide-formoterol Inhale two puffs with spacer twice daily to prevent cough or wheeze.  Rinse, gargle, and spit after use.   tamsulosin 0.4 MG Caps capsule Commonly known as: FLOMAX Take 0.4 mg by mouth at bedtime.   traMADol 50 MG tablet Commonly known as: ULTRAM Take 50 mg by mouth 4 (four) times daily as needed.   traZODone 100 MG tablet Commonly known as: DESYREL Take 100 mg by mouth at bedtime.   venlafaxine XR 150 MG 24 hr capsule Commonly known as: EFFEXOR-XR Take by mouth.    Past Medical History:  Diagnosis Date   Anxiety    Arthritis    Asthma    Depression    Enlarged prostate    Eosinophilic esophagitis    GERD (gastroesophageal reflux disease)    HTN (hypertension)     Obesity    Seasonal allergies     Past Surgical History:  Procedure Laterality Date   ANKLE SURGERY Right    CIRCUMCISION     KNEE ARTHROSCOPY Left    NASAL SEPTUM SURGERY Bilateral 02/23/2021   Radial fracture surgery Right    VENTRAL HERNIA REPAIR     WRIST ARTHROSCOPY Right     Review of systems negative except as noted in HPI / PMHx or noted below:  Review of Systems  Constitutional: Negative.   HENT: Negative.    Eyes: Negative.   Respiratory: Negative.    Cardiovascular: Negative.   Gastrointestinal: Negative.   Genitourinary: Negative.   Musculoskeletal: Negative.   Skin: Negative.   Neurological: Negative.   Endo/Heme/Allergies: Negative.   Psychiatric/Behavioral: Negative.       Objective:   Vitals:  12/14/21 1640  BP: (!) 132/92  Pulse: (!) 104  Resp: 16  SpO2: 97%          Physical Exam Constitutional:      Appearance: He is not diaphoretic.  HENT:     Head: Normocephalic.     Right Ear: Tympanic membrane, ear canal and external ear normal.     Left Ear: Tympanic membrane, ear canal and external ear normal.     Nose: Nose normal. No mucosal edema or rhinorrhea.     Mouth/Throat:     Pharynx: Uvula midline. No oropharyngeal exudate.  Eyes:     Conjunctiva/sclera: Conjunctivae normal.  Neck:     Thyroid: No thyromegaly.     Trachea: Trachea normal. No tracheal tenderness or tracheal deviation.  Cardiovascular:     Rate and Rhythm: Normal rate and regular rhythm.     Heart sounds: Normal heart sounds, S1 normal and S2 normal. No murmur heard. Pulmonary:     Effort: No respiratory distress.     Breath sounds: Normal breath sounds. No stridor. No wheezing or rales.  Lymphadenopathy:     Head:     Right side of head: No tonsillar adenopathy.     Left side of head: No tonsillar adenopathy.     Cervical: No cervical adenopathy.  Skin:    Findings: No erythema or rash.     Nails: There is no clubbing.  Neurological:     Mental Status: He  is alert.     Diagnostics:    Spirometry was performed and demonstrated an FEV1 of 4.73 at 85 % of predicted.  Assessment and Plan:   1. Eosinophilic esophagitis   2. Asthma, moderate persistent, well-controlled   3. Perennial allergic rhinitis   4. Allergy with anaphylaxis due to food   5. Gastroesophageal reflux disease, unspecified whether esophagitis present     1.  Allergen avoidance measures to pollen, cat, dog, and dust mites  2.  Avoid peanuts egg, and tree nuts except almonds  3.  Continue dupilumab injections every week  4.  DECREASE Symbicort 160-2 puffs 1-2 times a day with a spacer.    5.  DECREASE Flonase 1-2 sprays in each nostril once a day  6. Continue Protonix '40mg'$  -  2 times per day + famotidine 40 mg in evening  7. If needed:   A. Albuterol HFA - 2 inhalations every 4-6 hours  B. Cetirizine 10 mg once a day for a runny nose  C. EpiPen  D. Nasal saline rinses  8. Consider starting a course of immunotherapy  9. Return to clinic in 12 weeks or earlier if problem  I think that Kartik is doing a lot better at this point in time on dupilumab regarding his eosinophilic esophagitis and I suspect that we will be able to consolidate his airway inflammatory plan given the fact that he is on dupilumab at this point and he can decrease his Symbicort and Flonase as noted above.  He is interested in starting a course of immunotherapy and we have given him literature on this form of treatment during today's visit.  Assuming he does well with this plan I will see him back in this clinic in 12 weeks or earlier if there is a problem.  We plan on keeping him on dupilumab for all of 2023.  Allena Katz, MD Allergy / Immunology [place]

## 2021-12-14 NOTE — Patient Instructions (Addendum)
  1.  Allergen avoidance measures to pollen, cat, dog, and dust mites  2.  Avoid peanuts egg, and tree nuts except almonds  3.  Continue dupilumab injections every week  4.  DECREASE Symbicort 160-2 puffs 1-2 times a day with a spacer.    5.  DECREASE Flonase 1-2 sprays in each nostril once a day  6. Continue Protonix '40mg'$  -  2 times per day + famotidine 40 mg in evening  7. If needed:   A. Albuterol HFA - 2 inhalations every 4-6 hours  B. Cetirizine 10 mg once a day for a runny nose  C. EpiPen  D. Nasal saline rinses  8. Consider starting a course of immunotherapy  9. Return to clinic in 12 weeks or earlier if problem

## 2021-12-15 ENCOUNTER — Encounter: Payer: Self-pay | Admitting: Allergy and Immunology

## 2021-12-18 ENCOUNTER — Ambulatory Visit
Admission: RE | Admit: 2021-12-18 | Discharge: 2021-12-18 | Disposition: A | Discharge status: Home / Self Care | Payer: Medicaid Other | Source: Ambulatory Visit | Attending: Podiatry | Admitting: Podiatry

## 2021-12-18 DIAGNOSIS — S99912D Unspecified injury of left ankle, subsequent encounter: Secondary | ICD-10-CM

## 2021-12-18 DIAGNOSIS — M25572 Pain in left ankle and joints of left foot: Secondary | ICD-10-CM

## 2021-12-19 ENCOUNTER — Ambulatory Visit: Payer: Medicaid Other | Admitting: Podiatry

## 2021-12-19 ENCOUNTER — Telehealth: Payer: Self-pay

## 2021-12-19 ENCOUNTER — Encounter: Payer: Self-pay | Admitting: Podiatry

## 2021-12-19 ENCOUNTER — Other Ambulatory Visit: Payer: Self-pay | Admitting: Allergy and Immunology

## 2021-12-19 DIAGNOSIS — G8929 Other chronic pain: Secondary | ICD-10-CM

## 2021-12-19 DIAGNOSIS — M25572 Pain in left ankle and joints of left foot: Secondary | ICD-10-CM

## 2021-12-19 DIAGNOSIS — M25571 Pain in right ankle and joints of right foot: Secondary | ICD-10-CM

## 2021-12-19 DIAGNOSIS — M779 Enthesopathy, unspecified: Secondary | ICD-10-CM | POA: Diagnosis not present

## 2021-12-19 DIAGNOSIS — J4540 Moderate persistent asthma, uncomplicated: Secondary | ICD-10-CM

## 2021-12-19 DIAGNOSIS — J3089 Other allergic rhinitis: Secondary | ICD-10-CM

## 2021-12-19 NOTE — Progress Notes (Signed)
Aeroallergen Immunotherapy   Ordering Provider: Dr. Allena Katz   Patient Details  Name: Bob Roy  MRN: [id number]  Date of Birth: 09/18/2000   Order one of two   Vial Label: dust mite, weed   0.3 ml (Volume)  1:20 Concentration -- Ragweed Mix  0.5 ml (Volume)  1:20 Concentration -- Weed Mix*  0.5 ml (Volume)   AU Concentration -- Mite Mix (DF 5,000 & DP 5,000)    1.3  ml Extract Subtotal  3.7  ml Diluent  5.0  ml Maintenance Total   Blue Vial (1:100,000): Schedule B (6 doses)  Yellow Vial (1:10,000): Schedule B (6 doses)  Green Vial (1:1,000): Schedule B (6 doses)  Red Vial (1:100): Schedule A (12 doses)   Special Instructions: 1-2 times per week

## 2021-12-19 NOTE — Telephone Encounter (Signed)
Bob Roy, Legal Assistant/ [employer] - [phone] called in - DOB verified - requesting status of faxed request for patient's medical records and itemized billing from 01/26/21 - 11/10/21.   Bob Roy was advised message would be forwarded to medicals records and billing for status.

## 2021-12-19 NOTE — Progress Notes (Signed)
VIALS EXP 12-20-22

## 2021-12-19 NOTE — Progress Notes (Signed)
Aeroallergen Immunotherapy   Ordering Provider: Dr. Allena Katz   Patient Details  Name: Bob Roy  MRN: [id number]  Date of Birth: 12-30-2000   Order two of two   Vial Label: tree, grass, cat, dog   0.3 ml (Volume)  BAU Concentration -- 7 Grass Mix* 100,000 ([address] [employer], [place], [place], Perennial [place], RedTop, Sweet Vernal, Timothy)  0.1 ml (Volume)  BAU Concentration -- [place] 10,000  0.1 ml (Volume)  1:20 Concentration -- [place]  0.5 ml (Volume)  1:20 Concentration -- Eastern 10 Tree Mix (also Sweet Gum)  0.1 ml (Volume)  1:20 Concentration -- Box Elder  0.1 ml (Volume)  1:10 Concentration -- Cedar, red  0.1 ml (Volume)  1:10 Concentration -- Pecan Pollen  0.3 ml (Volume)  1:10 Concentration -- Cat Hair  0.3 ml (Volume)  1:10 Concentration -- Dog Epithelia    1.9  ml Extract Subtotal  3.1  ml Diluent  5.0  ml Maintenance Total   Blue Vial (1:100,000): Schedule B (6 doses)  Yellow Vial (1:10,000): Schedule B (6 doses)  Green Vial (1:1,000): Schedule B (6 doses)  Red Vial (1:100): Schedule A (12 doses)   Special Instructions: 1-2 times per week

## 2021-12-19 NOTE — Progress Notes (Signed)
Subjective: Bob Roy is a 21 y.o. male patient seen today in office for follow-up evaluation right foot/ankle pain at area of previous surgery and worsening pain in left ankle. Here to review MRI. Relates injection have been helping. . . No other issues noted.   Patient Active Problem List   Diagnosis Date Noted   Asthma, well controlled, mild persistent 78/58/8502   Eosinophilic esophagitis 77/41/2878   Allergy with anaphylaxis due to food 08/19/2021   Muscle strain 02/02/2021   Contusion of right hand 05/05/2020   Right wrist pain 09/19/2019   Lumbar back pain 09/10/2019   Displaced fracture of right radial styloid process, subsequent encounter for closed fracture with nonunion 07/10/2019   Traumatic tear of triangular fibrocartilage complex of right wrist 67/67/2094   Umbilical pain 70/96/2836   Postoperative examination 02/20/2018   Ventral hernia without obstruction or gangrene 02/06/2018   Myofascial pain 04/25/2016   Asthma 03/23/2015   AR (allergic rhinitis) 03/23/2015   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 8.5 each 0   amLODipine (NORVASC) 5 MG tablet Take by mouth.     celecoxib (CELEBREX) 200 MG capsule Take by mouth.     cetirizine (ZYRTEC) 10 MG tablet Can take one tablet by mouth once daily if needed for runny nose. 90 tablet 1   cyclobenzaprine (FLEXERIL) 5 MG tablet Take by mouth.     diclofenac (FLECTOR)  1.3 % PTCH Place 1 patch onto the skin 2 (two) times daily. 90 patch 3   DUPIXENT 300 MG/2ML prefilled syringe Inject into the skin once a week.     EPIPEN 2-PAK 0.3 MG/0.3ML SOAJ injection Use as directed for life threatening allergic reactions 4 each 3   EQ PAIN RELIEVER 500 MG tablet TAKE 1 TABLET BY MOUTH EVERY 6 HOURS AS NEEDED 30 tablet 0   famotidine (PEPCID) 20 MG tablet Take 1 tablet (20 mg total) by mouth daily. 90 tablet 0   fluticasone (FLONASE) 50 MCG/ACT nasal spray USE 1 TO 2 SPRAY(S) IN EACH NOSTRIL ONCE DAILY 16 g 2   gabapentin (NEURONTIN) 300 MG capsule Take 1 capsule by mouth 3 (three) times daily.     levofloxacin (LEVAQUIN) 750 MG tablet Take 750 mg by mouth daily.     metroNIDAZOLE (FLAGYL) 500 MG tablet Take 500 mg by mouth 3 (three) times daily.     mirtazapine (REMERON SOL-TAB) 45 MG disintegrating tablet DISSOLVE 1 TABLET BY MOUTH NIGHTLY     OLANZapine (ZYPREXA) 5 MG tablet Take by mouth.     pantoprazole (PROTONIX) 40 MG tablet Take 1 tablet (40 mg total) by mouth 2 (two) times daily. 180 tablet 0   Spacer/Aero-Holding [facility] Use with inhaler as directed. 1 each 1   SYMBICORT 160-4.5 MCG/ACT inhaler Inhale two puffs with spacer twice daily to prevent cough or wheeze.  Rinse, gargle, and spit after use. 30.6 g 1   tamsulosin (FLOMAX) 0.4 MG CAPS capsule Take 0.4 mg by mouth at  bedtime.     traMADol (ULTRAM) 50 MG tablet Take 50 mg by mouth 4 (four) times daily as needed.     traZODone (DESYREL) 100 MG tablet Take 100 mg by mouth at bedtime.     venlafaxine XR (EFFEXOR-XR) 150 MG 24 hr capsule Take by mouth.     No current facility-administered medications on file prior to visit.    Allergies  Allergen Reactions   Clavulanic Acid    Gluten Meal    Misc. Sulfonamide Containing Compounds    Tilactase    Amoxicillin Rash   Augmentin [Amoxicillin-Pot Clavulanate] Rash   Elemental Sulfur Rash   Sulfa Antibiotics Rash    Objective: There were no vitals  filed for this visit.  General: No acute distress, AAOx3  Derm: Incision well-healed with mild scar noted over the dorsal lateral ankle no significant soft tissue swelling bruising erythema edema or warmth to the affected area at the right dorsal lateral foot or ankle Neurovascular: Gross sensation present via light touch to right foot and ankle DP and PT pedal pulses are palpable Orthopedic: There is pain to palpation to the dorsal lateral foot and ankle bilateral pain is worse on the right over the lateral ankle ligaments and sinus tarsi area with very minimal hypertrophic scar at the right dorsal lateral foot and ankle.There is also some tenderness at right heel at achilles insertion.   Assessment and Plan:  Problem List Items Addressed This Visit   None Visit Diagnoses     Chronic pain of right ankle    -  Primary   Sinus tarsi syndrome of left ankle       Capsulitis           -Patient seen and evaluated -Discussed with patient with chronic pain right foot/ankle -No injections today.  -Advised patient that he may always have pain of which I suspect is a chronic pain syndrome versus CRPS and that he should continue to follow-up as scheduled with pain management -Continue with Flector patch as directed. Refill provided.  -Left sinus tarsi area worsening. MRI reviewed. Still waiting on official read but no noticible edema noted in sinus tarsi some edema around PT tendon.  -Orthopedic notes reviewed  -Continue PT  -Return to office as needed or if symptoms fail to continue to improve.    Lorenda Peck , DPM

## 2021-12-20 DIAGNOSIS — J3081 Allergic rhinitis due to animal (cat) (dog) hair and dander: Secondary | ICD-10-CM

## 2021-12-26 ENCOUNTER — Encounter: Payer: Self-pay | Admitting: Cardiology

## 2021-12-26 ENCOUNTER — Ambulatory Visit (INDEPENDENT_AMBULATORY_CARE_PROVIDER_SITE_OTHER): Payer: Medicaid Other

## 2021-12-26 ENCOUNTER — Ambulatory Visit (INDEPENDENT_AMBULATORY_CARE_PROVIDER_SITE_OTHER): Payer: Medicaid Other | Admitting: Cardiology

## 2021-12-26 DIAGNOSIS — R002 Palpitations: Secondary | ICD-10-CM

## 2021-12-26 DIAGNOSIS — R0609 Other forms of dyspnea: Secondary | ICD-10-CM

## 2021-12-26 DIAGNOSIS — E785 Hyperlipidemia, unspecified: Secondary | ICD-10-CM | POA: Diagnosis not present

## 2021-12-27 LAB — LIPID PANEL
Chol/HDL Ratio: 4.6 ratio (ref 0.0–5.0)
Cholesterol, Total: 205 mg/dL — ABNORMAL HIGH (ref 100–199)
HDL: 45 mg/dL
LDL Chol Calc (NIH): 131 mg/dL — ABNORMAL HIGH (ref 0–99)
Triglycerides: 162 mg/dL — ABNORMAL HIGH (ref 0–149)
VLDL Cholesterol Cal: 29 mg/dL (ref 5–40)

## 2022-01-09 ENCOUNTER — Encounter: Payer: Self-pay | Admitting: Podiatry

## 2022-01-09 ENCOUNTER — Ambulatory Visit (INDEPENDENT_AMBULATORY_CARE_PROVIDER_SITE_OTHER): Payer: Medicaid Other | Admitting: *Deleted

## 2022-01-09 ENCOUNTER — Ambulatory Visit: Payer: Medicaid Other | Admitting: Podiatry

## 2022-01-09 ENCOUNTER — Ambulatory Visit (INDEPENDENT_AMBULATORY_CARE_PROVIDER_SITE_OTHER): Payer: Medicaid Other

## 2022-01-09 DIAGNOSIS — G8929 Other chronic pain: Secondary | ICD-10-CM | POA: Diagnosis not present

## 2022-01-09 DIAGNOSIS — J309 Allergic rhinitis, unspecified: Secondary | ICD-10-CM | POA: Diagnosis not present

## 2022-01-09 DIAGNOSIS — R0609 Other forms of dyspnea: Secondary | ICD-10-CM

## 2022-01-09 DIAGNOSIS — M25571 Pain in right ankle and joints of right foot: Secondary | ICD-10-CM

## 2022-01-09 DIAGNOSIS — M25572 Pain in left ankle and joints of left foot: Secondary | ICD-10-CM | POA: Diagnosis not present

## 2022-01-09 DIAGNOSIS — M779 Enthesopathy, unspecified: Secondary | ICD-10-CM | POA: Diagnosis not present

## 2022-01-09 LAB — ECHOCARDIOGRAM COMPLETE
Area-P 1/2: 4.89 cm2
S' Lateral: 3.75 cm

## 2022-01-09 MED ORDER — PERFLUTREN LIPID MICROSPHERE
1.0000 mL | INTRAVENOUS | Status: AC | PRN
  Administered 2022-01-09: 4 mL via INTRAVENOUS

## 2022-01-09 NOTE — Telephone Encounter (Signed)
Per the Billing department:    Medical records have been resent to BOTH fax numbers on the form. They were sent before, but apparently they did not receive them, so that is why I faxed them to both numbers that were given on the form.  Thanks.

## 2022-01-09 NOTE — Progress Notes (Signed)
Subjective: Bob GIESELMAN is a 21 y.o. male patient seen today in office for follow-up evaluation right foot/ankle pain at area of previous surgery and worsening pain in left ankle. Here to review MRI. Relates injection have been helping. . . No other issues noted.   Patient Active Problem List   Diagnosis Date Noted   Palpitations 12/26/2021   Dyspnea on exertion 12/26/2021   Dyslipidemia 12/26/2021   Asthma, well controlled, mild persistent 91/47/8295   Eosinophilic esophagitis 62/13/0865   Allergy with anaphylaxis due to food 08/19/2021   Muscle strain 02/02/2021   Contusion of right hand 05/05/2020   Right wrist pain 09/19/2019   Lumbar back pain 09/10/2019   Displaced fracture of right radial styloid process, subsequent encounter for closed fracture with nonunion 07/10/2019   Traumatic tear of triangular fibrocartilage complex of right wrist 78/46/9629   Umbilical pain 52/84/1324   Postoperative examination 02/20/2018   Ventral hernia without obstruction or gangrene 02/06/2018   Myofascial pain 04/25/2016   Asthma 03/23/2015   AR (allergic rhinitis) 03/23/2015   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 (Patient taking differently: Inhale 2 puffs into the lungs every 6 (six) hours as needed for wheezing or shortness of breath. Can inhale two puffs every four to six hours as needed for cough or wheeze.) 8.5 each 0   celecoxib  (CELEBREX) 200 MG capsule Take 200 mg by mouth daily.     cetirizine (ZYRTEC) 10 MG tablet Can take one tablet by mouth once daily if needed for runny nose. (Patient taking differently: Take 10 mg by mouth daily as needed for allergies or rhinitis. Can take one tablet by mouth once daily if needed for runny nose.) 90 tablet 1   cyclobenzaprine (FLEXERIL) 5 MG tablet Take 5 mg by mouth 3 (three) times daily as needed for muscle spasms.     diclofenac (FLECTOR) 1.3 % PTCH Place 1 patch onto the skin 2 (two) times daily. 90 patch 3   DUPIXENT 300 MG/2ML prefilled syringe Inject 300 mg into the skin once a week.     EPIPEN 2-PAK 0.3 MG/0.3ML SOAJ injection Use as directed for life threatening allergic reactions (Patient taking differently: Inject 0.3 mg into the muscle as needed for anaphylaxis. Use as directed for life threatening allergic reactions) 4 each 3   famotidine (PEPCID) 20 MG tablet Take 1 tablet (20 mg total) by mouth daily. 90 tablet 0   fluticasone (FLONASE) 50 MCG/ACT nasal spray USE 1 TO 2 SPRAY(S) IN EACH NOSTRIL ONCE DAILY (Patient taking differently: Place 2 sprays into both nostrils daily. USE 1 TO 2 SPRAY(S) IN EACH NOSTRIL ONCE DAILY) 16 g 2   gabapentin (NEURONTIN) 300 MG capsule Take 1 capsule by mouth 3 (three) times daily.     mirtazapine (REMERON SOL-TAB) 45 MG disintegrating tablet Take 45 mg by mouth at bedtime.     OLANZapine (ZYPREXA) 5 MG tablet Take 5 mg by mouth daily.     pantoprazole (PROTONIX)  40 MG tablet Take 1 tablet (40 mg total) by mouth 2 (two) times daily. 180 tablet 0   Spacer/Aero-Holding [facility] Use with inhaler as directed. (Patient taking differently: 1 each by Other route See admin instructions. Use with inhaler as directed.) 1 each 1   SYMBICORT 160-4.5 MCG/ACT inhaler Inhale two puffs with spacer twice daily to prevent cough or wheeze.  Rinse, gargle, and spit after use. (Patient taking differently: Inhale 2 puffs into the lungs as needed (cough or  wheezing). Inhale two puffs with spacer twice daily to prevent cough or wheeze.  Rinse, gargle, and spit after use.) 30.6 g 1   tamsulosin (FLOMAX) 0.4 MG CAPS capsule Take 0.4 mg by mouth at bedtime.     traMADol (ULTRAM) 50 MG tablet Take 50 mg by mouth 4 (four) times daily as needed for moderate pain or severe pain.     traZODone (DESYREL) 100 MG tablet Take 100 mg by mouth at bedtime.     venlafaxine XR (EFFEXOR-XR) 150 MG 24 hr capsule Take by mouth.     No current facility-administered medications on file prior to visit.    Allergies  Allergen Reactions   Clavulanic Acid    Gluten Meal    Misc. Sulfonamide Containing Compounds    Tilactase    Amoxicillin Rash   Augmentin [Amoxicillin-Pot Clavulanate] Rash   Elemental Sulfur Rash   Sulfa Antibiotics Rash    Objective: There were no vitals filed for this visit.  General: No acute distress, AAOx3  Derm: Incision well-healed with mild scar noted over the dorsal lateral ankle no significant soft tissue swelling bruising erythema edema or warmth to the affected area at the right dorsal lateral foot or ankle Neurovascular: Gross sensation present via light touch to right foot and ankle DP and PT pedal pulses are palpable Orthopedic: There is pain to palpation to the dorsal lateral foot and ankle bilateral pain is worse on the right over the lateral ankle ligaments and sinus tarsi area with very minimal hypertrophic scar at the right dorsal lateral foot and ankle.There is also some tenderness at right heel at achilles insertion.    MRI left ankle.  1. Moderate amount of fluid the flexor hallucis longus tendon tendons are however intact. Nonspecific finding, it may be secondary to tenosynovitis.   2.  Ligaments are intact.   3.  No evidence of fracture or osteonecrosis.  Assessment and Plan:  Problem List Items Addressed This Visit   None    -Patient seen and evaluated -Discussed with patient with chronic pain right  foot/ankle -No injections today.  -Advised patient that he may always have pain of which I suspect is a chronic pain syndrome versus CRPS and that he should continue to follow-up as scheduled with pain management -Continue with Flector patch as directed. -. MRI reviewed. No major areas of concern or cause for pain.  -Orthopedic notes reviewed  -Continue PT  -Continue pain management.  -Return to office as needed or if symptoms fail to continue to improve.    Lorenda Peck , DPM

## 2022-01-09 NOTE — Progress Notes (Unsigned)
Immunotherapy   Patient Details  Name: Bob Roy MRN: [id number] Date of Birth: [birth year]  01/09/2022  [name]   Following schedule: B  Frequency:2 times per week Epi-Pen:Epi-Pen Available  Consent signed and patient instructions given.   Glendell Docker 01/09/2022, 10:22 AM

## 2022-01-12 ENCOUNTER — Ambulatory Visit (INDEPENDENT_AMBULATORY_CARE_PROVIDER_SITE_OTHER): Payer: Medicaid Other | Admitting: *Deleted

## 2022-01-12 DIAGNOSIS — J309 Allergic rhinitis, unspecified: Secondary | ICD-10-CM

## 2022-01-16 ENCOUNTER — Ambulatory Visit (INDEPENDENT_AMBULATORY_CARE_PROVIDER_SITE_OTHER): Payer: Medicaid Other | Admitting: *Deleted

## 2022-01-16 DIAGNOSIS — J309 Allergic rhinitis, unspecified: Secondary | ICD-10-CM | POA: Diagnosis not present

## 2022-01-18 ENCOUNTER — Ambulatory Visit (INDEPENDENT_AMBULATORY_CARE_PROVIDER_SITE_OTHER): Payer: Medicaid Other | Admitting: *Deleted

## 2022-01-18 DIAGNOSIS — J309 Allergic rhinitis, unspecified: Secondary | ICD-10-CM

## 2022-01-23 ENCOUNTER — Ambulatory Visit (INDEPENDENT_AMBULATORY_CARE_PROVIDER_SITE_OTHER): Payer: Medicaid Other | Admitting: *Deleted

## 2022-01-23 DIAGNOSIS — J309 Allergic rhinitis, unspecified: Secondary | ICD-10-CM

## 2022-01-25 ENCOUNTER — Ambulatory Visit (INDEPENDENT_AMBULATORY_CARE_PROVIDER_SITE_OTHER): Payer: Medicaid Other | Admitting: *Deleted

## 2022-01-25 DIAGNOSIS — J309 Allergic rhinitis, unspecified: Secondary | ICD-10-CM | POA: Diagnosis not present

## 2022-01-30 ENCOUNTER — Ambulatory Visit (INDEPENDENT_AMBULATORY_CARE_PROVIDER_SITE_OTHER): Payer: Medicaid Other | Admitting: *Deleted

## 2022-01-30 DIAGNOSIS — J309 Allergic rhinitis, unspecified: Secondary | ICD-10-CM

## 2022-02-01 ENCOUNTER — Ambulatory Visit (INDEPENDENT_AMBULATORY_CARE_PROVIDER_SITE_OTHER): Payer: Medicaid Other | Admitting: *Deleted

## 2022-02-01 DIAGNOSIS — J309 Allergic rhinitis, unspecified: Secondary | ICD-10-CM | POA: Diagnosis not present

## 2022-02-06 DIAGNOSIS — M25831 Other specified joint disorders, right wrist: Secondary | ICD-10-CM | POA: Insufficient documentation

## 2022-02-07 ENCOUNTER — Ambulatory Visit (INDEPENDENT_AMBULATORY_CARE_PROVIDER_SITE_OTHER): Payer: Medicaid Other | Admitting: *Deleted

## 2022-02-07 DIAGNOSIS — J309 Allergic rhinitis, unspecified: Secondary | ICD-10-CM | POA: Diagnosis not present

## 2022-02-09 ENCOUNTER — Ambulatory Visit (INDEPENDENT_AMBULATORY_CARE_PROVIDER_SITE_OTHER): Payer: Medicaid Other | Admitting: *Deleted

## 2022-02-09 DIAGNOSIS — J309 Allergic rhinitis, unspecified: Secondary | ICD-10-CM | POA: Diagnosis not present

## 2022-02-13 ENCOUNTER — Ambulatory Visit (INDEPENDENT_AMBULATORY_CARE_PROVIDER_SITE_OTHER): Payer: Medicaid Other | Admitting: *Deleted

## 2022-02-13 DIAGNOSIS — J309 Allergic rhinitis, unspecified: Secondary | ICD-10-CM | POA: Diagnosis not present

## 2022-02-15 ENCOUNTER — Ambulatory Visit (INDEPENDENT_AMBULATORY_CARE_PROVIDER_SITE_OTHER): Payer: Medicaid Other | Admitting: *Deleted

## 2022-02-15 DIAGNOSIS — J309 Allergic rhinitis, unspecified: Secondary | ICD-10-CM | POA: Diagnosis not present

## 2022-02-20 ENCOUNTER — Ambulatory Visit (INDEPENDENT_AMBULATORY_CARE_PROVIDER_SITE_OTHER): Payer: Medicaid Other | Admitting: *Deleted

## 2022-02-20 DIAGNOSIS — J309 Allergic rhinitis, unspecified: Secondary | ICD-10-CM | POA: Diagnosis not present

## 2022-02-22 ENCOUNTER — Ambulatory Visit (INDEPENDENT_AMBULATORY_CARE_PROVIDER_SITE_OTHER): Payer: Medicaid Other | Admitting: *Deleted

## 2022-02-22 DIAGNOSIS — J309 Allergic rhinitis, unspecified: Secondary | ICD-10-CM

## 2022-02-27 ENCOUNTER — Ambulatory Visit (INDEPENDENT_AMBULATORY_CARE_PROVIDER_SITE_OTHER): Payer: Medicaid Other | Admitting: *Deleted

## 2022-02-27 DIAGNOSIS — J309 Allergic rhinitis, unspecified: Secondary | ICD-10-CM | POA: Diagnosis not present

## 2022-03-01 ENCOUNTER — Ambulatory Visit (INDEPENDENT_AMBULATORY_CARE_PROVIDER_SITE_OTHER): Payer: Medicaid Other | Admitting: *Deleted

## 2022-03-01 DIAGNOSIS — J309 Allergic rhinitis, unspecified: Secondary | ICD-10-CM

## 2022-03-02 ENCOUNTER — Ambulatory Visit: Payer: Medicaid Other | Admitting: Cardiology

## 2022-03-07 ENCOUNTER — Ambulatory Visit (INDEPENDENT_AMBULATORY_CARE_PROVIDER_SITE_OTHER): Payer: Medicaid Other | Admitting: *Deleted

## 2022-03-07 DIAGNOSIS — J309 Allergic rhinitis, unspecified: Secondary | ICD-10-CM

## 2022-03-08 ENCOUNTER — Ambulatory Visit: Payer: Medicaid Other | Admitting: Allergy and Immunology

## 2022-03-09 ENCOUNTER — Ambulatory Visit (INDEPENDENT_AMBULATORY_CARE_PROVIDER_SITE_OTHER): Payer: Medicaid Other | Admitting: *Deleted

## 2022-03-09 DIAGNOSIS — J309 Allergic rhinitis, unspecified: Secondary | ICD-10-CM

## 2022-03-13 ENCOUNTER — Ambulatory Visit (INDEPENDENT_AMBULATORY_CARE_PROVIDER_SITE_OTHER): Payer: Medicaid Other | Admitting: *Deleted

## 2022-03-13 DIAGNOSIS — J309 Allergic rhinitis, unspecified: Secondary | ICD-10-CM | POA: Diagnosis not present

## 2022-03-20 ENCOUNTER — Ambulatory Visit (INDEPENDENT_AMBULATORY_CARE_PROVIDER_SITE_OTHER): Payer: Medicaid Other

## 2022-03-20 DIAGNOSIS — J309 Allergic rhinitis, unspecified: Secondary | ICD-10-CM

## 2022-03-27 ENCOUNTER — Ambulatory Visit (INDEPENDENT_AMBULATORY_CARE_PROVIDER_SITE_OTHER): Payer: Medicaid Other | Admitting: *Deleted

## 2022-03-27 DIAGNOSIS — J309 Allergic rhinitis, unspecified: Secondary | ICD-10-CM | POA: Diagnosis not present

## 2022-03-28 ENCOUNTER — Telehealth: Payer: Self-pay | Admitting: *Deleted

## 2022-03-28 ENCOUNTER — Other Ambulatory Visit (HOSPITAL_COMMUNITY): Payer: Self-pay

## 2022-03-28 MED ORDER — DUPIXENT 300 MG/2ML ~~LOC~~ SOSY
300.0000 mg | PREFILLED_SYRINGE | SUBCUTANEOUS | 11 refills | Status: DC
  Filled 2022-03-28 – 2022-03-31 (×2): qty 8, 28d supply, fill #0
  Filled 2022-05-01: qty 8, 28d supply, fill #1
  Filled 2022-06-05: qty 8, 28d supply, fill #2
  Filled 2022-06-27 (×2): qty 8, 28d supply, fill #3
  Filled 2022-07-24: qty 8, 30d supply, fill #4
  Filled 2022-07-24: qty 8, 28d supply, fill #4
  Filled 2022-08-15 – 2022-09-07 (×2): qty 8, 28d supply, fill #5
  Filled 2022-09-27: qty 8, 28d supply, fill #6
  Filled 2022-10-27: qty 8, 28d supply, fill #7
  Filled 2022-11-20: qty 8, 28d supply, fill #8
  Filled 2022-12-12: qty 8, 28d supply, fill #9
  Filled 2023-01-05: qty 8, 28d supply, fill #10
  Filled 2023-01-31: qty 8, 28d supply, fill #11

## 2022-03-28 NOTE — Telephone Encounter (Signed)
Advised patient change from [facility] to [facility] due to [facility] no longer dispensing biologics

## 2022-03-31 ENCOUNTER — Other Ambulatory Visit (HOSPITAL_COMMUNITY): Payer: Self-pay

## 2022-04-03 ENCOUNTER — Ambulatory Visit (INDEPENDENT_AMBULATORY_CARE_PROVIDER_SITE_OTHER): Payer: Medicaid Other | Admitting: *Deleted

## 2022-04-03 DIAGNOSIS — J309 Allergic rhinitis, unspecified: Secondary | ICD-10-CM | POA: Diagnosis not present

## 2022-04-04 ENCOUNTER — Other Ambulatory Visit (HOSPITAL_COMMUNITY): Payer: Self-pay

## 2022-04-13 ENCOUNTER — Ambulatory Visit (INDEPENDENT_AMBULATORY_CARE_PROVIDER_SITE_OTHER): Payer: Medicaid Other | Admitting: *Deleted

## 2022-04-13 DIAGNOSIS — J309 Allergic rhinitis, unspecified: Secondary | ICD-10-CM | POA: Diagnosis not present

## 2022-04-17 ENCOUNTER — Ambulatory Visit (INDEPENDENT_AMBULATORY_CARE_PROVIDER_SITE_OTHER): Payer: Medicaid Other | Admitting: *Deleted

## 2022-04-17 DIAGNOSIS — J309 Allergic rhinitis, unspecified: Secondary | ICD-10-CM

## 2022-04-19 ENCOUNTER — Telehealth: Payer: Self-pay | Admitting: *Deleted

## 2022-04-19 ENCOUNTER — Other Ambulatory Visit (HOSPITAL_COMMUNITY): Payer: Self-pay

## 2022-04-19 NOTE — Telephone Encounter (Signed)
L/m for patient to contact [facility] due to address Dupixent delivered to patient does not live at anymore

## 2022-04-20 ENCOUNTER — Other Ambulatory Visit: Payer: Self-pay | Admitting: Podiatry

## 2022-04-20 DIAGNOSIS — Z9889 Other specified postprocedural states: Secondary | ICD-10-CM

## 2022-04-24 ENCOUNTER — Ambulatory Visit (INDEPENDENT_AMBULATORY_CARE_PROVIDER_SITE_OTHER): Payer: Medicaid Other | Admitting: *Deleted

## 2022-04-24 DIAGNOSIS — J309 Allergic rhinitis, unspecified: Secondary | ICD-10-CM

## 2022-04-25 ENCOUNTER — Other Ambulatory Visit (HOSPITAL_COMMUNITY): Payer: Self-pay

## 2022-05-01 ENCOUNTER — Other Ambulatory Visit (HOSPITAL_COMMUNITY): Payer: Self-pay

## 2022-05-01 ENCOUNTER — Ambulatory Visit (INDEPENDENT_AMBULATORY_CARE_PROVIDER_SITE_OTHER): Payer: Medicaid Other | Admitting: *Deleted

## 2022-05-01 DIAGNOSIS — J309 Allergic rhinitis, unspecified: Secondary | ICD-10-CM

## 2022-05-03 ENCOUNTER — Other Ambulatory Visit: Payer: Self-pay | Admitting: Family Medicine

## 2022-05-08 ENCOUNTER — Ambulatory Visit (INDEPENDENT_AMBULATORY_CARE_PROVIDER_SITE_OTHER): Payer: Medicaid Other | Admitting: *Deleted

## 2022-05-08 DIAGNOSIS — J309 Allergic rhinitis, unspecified: Secondary | ICD-10-CM | POA: Diagnosis not present

## 2022-05-09 ENCOUNTER — Other Ambulatory Visit (HOSPITAL_COMMUNITY): Payer: Self-pay

## 2022-05-09 DIAGNOSIS — J3089 Other allergic rhinitis: Secondary | ICD-10-CM

## 2022-05-09 NOTE — Progress Notes (Signed)
VIALS EXP 05-10-23

## 2022-05-10 DIAGNOSIS — J3081 Allergic rhinitis due to animal (cat) (dog) hair and dander: Secondary | ICD-10-CM | POA: Diagnosis not present

## 2022-05-11 ENCOUNTER — Ambulatory Visit: Payer: Medicaid Other | Admitting: Cardiology

## 2022-05-15 ENCOUNTER — Ambulatory Visit (INDEPENDENT_AMBULATORY_CARE_PROVIDER_SITE_OTHER): Payer: Medicaid Other | Admitting: *Deleted

## 2022-05-15 DIAGNOSIS — J309 Allergic rhinitis, unspecified: Secondary | ICD-10-CM

## 2022-05-22 ENCOUNTER — Ambulatory Visit (INDEPENDENT_AMBULATORY_CARE_PROVIDER_SITE_OTHER): Payer: Medicaid Other | Admitting: *Deleted

## 2022-05-22 DIAGNOSIS — J309 Allergic rhinitis, unspecified: Secondary | ICD-10-CM

## 2022-05-29 ENCOUNTER — Ambulatory Visit (INDEPENDENT_AMBULATORY_CARE_PROVIDER_SITE_OTHER): Payer: Medicaid Other | Admitting: *Deleted

## 2022-05-29 DIAGNOSIS — J309 Allergic rhinitis, unspecified: Secondary | ICD-10-CM | POA: Diagnosis not present

## 2022-06-05 ENCOUNTER — Other Ambulatory Visit (HOSPITAL_COMMUNITY): Payer: Self-pay

## 2022-06-05 ENCOUNTER — Ambulatory Visit (INDEPENDENT_AMBULATORY_CARE_PROVIDER_SITE_OTHER): Payer: Medicaid Other | Admitting: *Deleted

## 2022-06-05 DIAGNOSIS — J309 Allergic rhinitis, unspecified: Secondary | ICD-10-CM | POA: Diagnosis not present

## 2022-06-12 ENCOUNTER — Ambulatory Visit (INDEPENDENT_AMBULATORY_CARE_PROVIDER_SITE_OTHER): Payer: Medicaid Other | Admitting: *Deleted

## 2022-06-12 DIAGNOSIS — J309 Allergic rhinitis, unspecified: Secondary | ICD-10-CM | POA: Diagnosis not present

## 2022-06-20 ENCOUNTER — Ambulatory Visit (INDEPENDENT_AMBULATORY_CARE_PROVIDER_SITE_OTHER): Payer: Medicaid Other

## 2022-06-20 DIAGNOSIS — J309 Allergic rhinitis, unspecified: Secondary | ICD-10-CM

## 2022-06-23 ENCOUNTER — Other Ambulatory Visit (HOSPITAL_COMMUNITY): Payer: Self-pay

## 2022-06-27 ENCOUNTER — Other Ambulatory Visit: Payer: Self-pay

## 2022-06-27 ENCOUNTER — Other Ambulatory Visit (HOSPITAL_COMMUNITY): Payer: Self-pay

## 2022-06-28 ENCOUNTER — Ambulatory Visit (INDEPENDENT_AMBULATORY_CARE_PROVIDER_SITE_OTHER): Payer: Medicaid Other | Admitting: *Deleted

## 2022-06-28 DIAGNOSIS — J309 Allergic rhinitis, unspecified: Secondary | ICD-10-CM

## 2022-07-04 ENCOUNTER — Ambulatory Visit (INDEPENDENT_AMBULATORY_CARE_PROVIDER_SITE_OTHER): Payer: Medicaid Other | Admitting: *Deleted

## 2022-07-04 DIAGNOSIS — J309 Allergic rhinitis, unspecified: Secondary | ICD-10-CM

## 2022-07-11 ENCOUNTER — Other Ambulatory Visit (HOSPITAL_COMMUNITY): Payer: Self-pay

## 2022-07-12 ENCOUNTER — Ambulatory Visit (INDEPENDENT_AMBULATORY_CARE_PROVIDER_SITE_OTHER): Payer: Medicaid Other | Admitting: Podiatry

## 2022-07-12 ENCOUNTER — Ambulatory Visit (INDEPENDENT_AMBULATORY_CARE_PROVIDER_SITE_OTHER): Payer: Medicaid Other | Admitting: *Deleted

## 2022-07-12 DIAGNOSIS — Z91199 Patient's noncompliance with other medical treatment and regimen due to unspecified reason: Secondary | ICD-10-CM

## 2022-07-12 DIAGNOSIS — J309 Allergic rhinitis, unspecified: Secondary | ICD-10-CM | POA: Diagnosis not present

## 2022-07-12 NOTE — Progress Notes (Signed)
No show

## 2022-07-18 ENCOUNTER — Other Ambulatory Visit (HOSPITAL_COMMUNITY): Payer: Self-pay

## 2022-07-20 ENCOUNTER — Ambulatory Visit (INDEPENDENT_AMBULATORY_CARE_PROVIDER_SITE_OTHER): Payer: Medicaid Other | Admitting: *Deleted

## 2022-07-20 ENCOUNTER — Telehealth: Payer: Self-pay | Admitting: *Deleted

## 2022-07-20 ENCOUNTER — Other Ambulatory Visit (HOSPITAL_COMMUNITY): Payer: Self-pay

## 2022-07-20 DIAGNOSIS — J309 Allergic rhinitis, unspecified: Secondary | ICD-10-CM | POA: Diagnosis not present

## 2022-07-20 NOTE — Telephone Encounter (Signed)
L/m for patient needs MD appt for Dupixent reaprpoval

## 2022-07-24 ENCOUNTER — Other Ambulatory Visit (HOSPITAL_COMMUNITY): Payer: Self-pay

## 2022-07-27 ENCOUNTER — Other Ambulatory Visit: Payer: Self-pay | Admitting: Podiatry

## 2022-07-27 ENCOUNTER — Ambulatory Visit: Payer: Medicaid Other | Admitting: Cardiology

## 2022-07-27 ENCOUNTER — Ambulatory Visit (INDEPENDENT_AMBULATORY_CARE_PROVIDER_SITE_OTHER): Payer: Medicaid Other | Admitting: *Deleted

## 2022-07-27 DIAGNOSIS — Z9889 Other specified postprocedural states: Secondary | ICD-10-CM

## 2022-07-27 DIAGNOSIS — J309 Allergic rhinitis, unspecified: Secondary | ICD-10-CM | POA: Diagnosis not present

## 2022-07-29 ENCOUNTER — Other Ambulatory Visit: Payer: Self-pay | Admitting: Podiatry

## 2022-08-03 ENCOUNTER — Ambulatory Visit (INDEPENDENT_AMBULATORY_CARE_PROVIDER_SITE_OTHER): Payer: Medicaid Other | Admitting: *Deleted

## 2022-08-03 DIAGNOSIS — J309 Allergic rhinitis, unspecified: Secondary | ICD-10-CM

## 2022-08-08 ENCOUNTER — Ambulatory Visit (INDEPENDENT_AMBULATORY_CARE_PROVIDER_SITE_OTHER): Payer: Medicaid Other | Admitting: *Deleted

## 2022-08-08 DIAGNOSIS — J309 Allergic rhinitis, unspecified: Secondary | ICD-10-CM | POA: Diagnosis not present

## 2022-08-09 NOTE — Progress Notes (Signed)
VIALS EXP 08-10-23

## 2022-08-10 DIAGNOSIS — J3089 Other allergic rhinitis: Secondary | ICD-10-CM

## 2022-08-11 DIAGNOSIS — J3081 Allergic rhinitis due to animal (cat) (dog) hair and dander: Secondary | ICD-10-CM

## 2022-08-15 ENCOUNTER — Ambulatory Visit (INDEPENDENT_AMBULATORY_CARE_PROVIDER_SITE_OTHER): Payer: Medicaid Other | Admitting: *Deleted

## 2022-08-15 ENCOUNTER — Other Ambulatory Visit (HOSPITAL_COMMUNITY): Payer: Self-pay

## 2022-08-15 DIAGNOSIS — J309 Allergic rhinitis, unspecified: Secondary | ICD-10-CM

## 2022-08-16 ENCOUNTER — Ambulatory Visit: Payer: Medicaid Other | Admitting: Allergy and Immunology

## 2022-08-16 ENCOUNTER — Other Ambulatory Visit: Payer: Self-pay

## 2022-08-17 ENCOUNTER — Other Ambulatory Visit (HOSPITAL_COMMUNITY): Payer: Self-pay

## 2022-08-21 ENCOUNTER — Ambulatory Visit (INDEPENDENT_AMBULATORY_CARE_PROVIDER_SITE_OTHER): Payer: Medicaid Other | Admitting: *Deleted

## 2022-08-21 DIAGNOSIS — J309 Allergic rhinitis, unspecified: Secondary | ICD-10-CM | POA: Diagnosis not present

## 2022-08-24 DIAGNOSIS — M25851 Other specified joint disorders, right hip: Secondary | ICD-10-CM | POA: Insufficient documentation

## 2022-08-24 DIAGNOSIS — M25852 Other specified joint disorders, left hip: Secondary | ICD-10-CM | POA: Insufficient documentation

## 2022-08-24 DIAGNOSIS — M24159 Other articular cartilage disorders, unspecified hip: Secondary | ICD-10-CM | POA: Insufficient documentation

## 2022-08-29 ENCOUNTER — Ambulatory Visit (INDEPENDENT_AMBULATORY_CARE_PROVIDER_SITE_OTHER): Payer: Medicaid Other | Admitting: *Deleted

## 2022-08-29 DIAGNOSIS — J309 Allergic rhinitis, unspecified: Secondary | ICD-10-CM

## 2022-09-04 ENCOUNTER — Ambulatory Visit (INDEPENDENT_AMBULATORY_CARE_PROVIDER_SITE_OTHER): Payer: Medicaid Other | Admitting: *Deleted

## 2022-09-04 DIAGNOSIS — J309 Allergic rhinitis, unspecified: Secondary | ICD-10-CM | POA: Diagnosis not present

## 2022-09-06 ENCOUNTER — Ambulatory Visit (INDEPENDENT_AMBULATORY_CARE_PROVIDER_SITE_OTHER): Payer: Medicaid Other | Admitting: Allergy and Immunology

## 2022-09-06 ENCOUNTER — Encounter: Payer: Self-pay | Admitting: Allergy and Immunology

## 2022-09-06 VITALS — BP 124/82 | HR 94 | Resp 16 | Ht 75.0 in | Wt 265.4 lb

## 2022-09-06 DIAGNOSIS — K200 Eosinophilic esophagitis: Secondary | ICD-10-CM

## 2022-09-06 DIAGNOSIS — K219 Gastro-esophageal reflux disease without esophagitis: Secondary | ICD-10-CM

## 2022-09-06 DIAGNOSIS — J3089 Other allergic rhinitis: Secondary | ICD-10-CM

## 2022-09-06 DIAGNOSIS — J4540 Moderate persistent asthma, uncomplicated: Secondary | ICD-10-CM | POA: Diagnosis not present

## 2022-09-06 DIAGNOSIS — T7800XA Anaphylactic reaction due to unspecified food, initial encounter: Secondary | ICD-10-CM | POA: Diagnosis not present

## 2022-09-06 NOTE — Progress Notes (Unsigned)
[facility] [place] [facility] [place]   Follow-up Note  Referring Provider: Nicoletta Dress, MD Primary Provider: Nicoletta Dress, MD Date of Office Visit: 09/06/2022  Subjective:   Strandquist (DOB: 17-Apr-2001) is a 22 y.o. 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  His last visit to this clinic with me was 14 December 2021.  He has really done very well regarding his EOE.  Apparently he did have an endoscopy and dilation in the fall 2023 with Dr. Lyda Jester but does not really have any issues with swallowing or pain or discomfort while he continues on dupilumab every week and pantoprazole and famotidine and occasionally adds in some Mylanta.  He has had no problems with his asthma and has not required a systemic steroid to treat an exacerbation.  He is not really using Symbicort at this point and does not use any albuterol.  His nose is doing okay.  He did not require an antibiotic for an episode of sinusitis.  He is always a little bit stuffy even though he uses Flonase on a consistent basis.  He has started immunotherapy.  He has received the flu vaccine.  He does not eat eggs, peanuts, tree nuts.  Allergies as of 09/06/2022       Reactions   Clavulanic Acid    Gluten Meal    Misc. Sulfonamide Containing Compounds    Tilactase    Amoxicillin Rash   Augmentin [amoxicillin-pot Clavulanate] Rash   Elemental Sulfur Rash   Sulfa Antibiotics Rash        Medication List    albuterol 108 (90 Base) MCG/ACT inhaler Commonly known as: ProAir HFA Can inhale two puffs every four to six hours as needed for cough or wheeze.   celecoxib 200 MG capsule Commonly known as: CELEBREX Take 200 mg by mouth daily.   cetirizine 10 MG tablet Commonly known as:  ZYRTEC TAKE 1 TABLET BY MOUTH ONCE DAILY AS NEEDED FOR ALLERGIES   cyclobenzaprine 5 MG tablet Commonly known as: FLEXERIL Take 5 mg by mouth 3 (three) times daily as needed for muscle spasms.   diclofenac 1.3 % Ptch Commonly known as: Flector Place 1 patch onto the skin 2 (two) times daily.   Dupixent 300 MG/2ML prefilled syringe Generic drug: dupilumab Inject 300 mg into the skin every 7 (seven) days.   EpiPen 2-Pak 0.3 mg/0.3 mL Soaj injection Generic drug: EPINEPHrine Use as directed for life threatening allergic reactions   famotidine 20 MG tablet Commonly known as: PEPCID Take 1 tablet (20 mg total) by mouth daily.   fluticasone 50 MCG/ACT nasal spray Commonly known as: FLONASE USE 1 TO 2 SPRAY(S) IN EACH NOSTRIL ONCE DAILY   gabapentin 300 MG capsule Commonly known as: NEURONTIN Take 1 capsule by mouth 3 (three) times daily.   mirtazapine 45 MG disintegrating tablet Commonly known as: REMERON SOL-TAB Take 45 mg by mouth at bedtime.   OLANZapine 5 MG tablet Commonly known as: ZYPREXA Take 5 mg by mouth daily.   pantoprazole 40 MG tablet Commonly known as: PROTONIX Take 1 tablet (40 mg total) by mouth 2 (two) times daily.   Spacer/Aero-Holding [employer] Use with inhaler as directed.   Symbicort 160-4.5 MCG/ACT inhaler Generic drug: budesonide-formoterol Inhale two puffs with spacer twice daily  to prevent cough or wheeze.  Rinse, gargle, and spit after use.   tamsulosin 0.4 MG Caps capsule Commonly known as: FLOMAX Take 0.4 mg by mouth at bedtime.   traMADol 50 MG tablet Commonly known as: ULTRAM Take 50 mg by mouth 4 (four) times daily as needed for moderate pain or severe pain.   traZODone 100 MG tablet Commonly known as: DESYREL Take 100 mg by mouth at bedtime.   venlafaxine XR 150 MG 24 hr capsule Commonly known as: EFFEXOR-XR Take by mouth.    Past Medical History:  Diagnosis Date   Anxiety    Arthritis    Asthma    Depression     Enlarged prostate    Eosinophilic esophagitis    GERD (gastroesophageal reflux disease)    HTN (hypertension)    Obesity    Seasonal allergies     Past Surgical History:  Procedure Laterality Date   ANKLE SURGERY Right    CIRCUMCISION     KNEE ARTHROSCOPY Left    NASAL SEPTUM SURGERY Bilateral 02/23/2021   Radial fracture surgery Right    VENTRAL HERNIA REPAIR     WRIST ARTHROSCOPY Right     Review of systems negative except as noted in HPI / PMHx or noted below:  Review of Systems  Constitutional: Negative.   HENT: Negative.    Eyes: Negative.   Respiratory: Negative.    Cardiovascular: Negative.   Gastrointestinal: Negative.   Genitourinary: Negative.   Musculoskeletal: Negative.   Skin: Negative.   Neurological: Negative.   Endo/Heme/Allergies: Negative.   Psychiatric/Behavioral: Negative.       Objective:   Vitals:   09/06/22 0840  BP: 124/82  Pulse: 94  Resp: 16  SpO2: 97%   Height: '6\' 3"'$  (190.5 cm)  Weight: 265 lb 6.4 oz (120.4 kg)   Physical Exam Constitutional:      Appearance: He is not diaphoretic.  HENT:     Head: Normocephalic.     Right Ear: Tympanic membrane, ear canal and external ear normal.     Left Ear: Tympanic membrane, ear canal and external ear normal.     Nose: Nose normal. No mucosal edema or rhinorrhea.     Mouth/Throat:     Pharynx: Uvula midline. No oropharyngeal exudate.  Eyes:     Conjunctiva/sclera: Conjunctivae normal.  Neck:     Thyroid: No thyromegaly.     Trachea: Trachea normal. No tracheal tenderness or tracheal deviation.  Cardiovascular:     Rate and Rhythm: Normal rate and regular rhythm.     Heart sounds: Normal heart sounds, S1 normal and S2 normal. No murmur heard. Pulmonary:     Effort: No respiratory distress.     Breath sounds: Normal breath sounds. No stridor. No wheezing or rales.  Lymphadenopathy:     Head:     Right side of head: No tonsillar adenopathy.     Left side of head: No tonsillar  adenopathy.     Cervical: No cervical adenopathy.  Skin:    Findings: No erythema or rash.     Nails: There is no clubbing.  Neurological:     Mental Status: He is alert.     Diagnostics:    Spirometry was performed and demonstrated an FEV1 of 5.52 at 102 % of predicted.   Assessment and Plan:   1. Asthma, moderate persistent, well-controlled   2. Perennial allergic rhinitis   3. Eosinophilic esophagitis   4. Allergy with anaphylaxis due to food  5. Gastroesophageal reflux disease, unspecified whether esophagitis present    1.  Allergen avoidance measures to pollen, cat, dog, dust mites, peanuts egg, and tree nuts except almonds  2.  Continue to treat and prevent inflammation:  A. Dupilumab injections every week B. Symbicort 160-2 puffs 1-2 times a day with a spacer C. Flonase 1-2 sprays in each nostril 1-2 times a day D. Immunotherapy  3. Continue Protonix '40mg'$  -  2 times per day + famotidine 40 mg in evening  4. If needed:   A. Albuterol HFA - 2 inhalations every 4-6 hours  B. Cetirizine 10 mg once a day for a runny nose  C. EpiPen  D. Nasal saline rinses  5. Return to clinic in 6 months or earlier if problem  Lottie will continue to use anti-inflammatory agents for both his respiratory tract and GI tract with the use of anti-IL-4/IL-13 biologic agent and continue on immunotherapy and if needed topical anti-inflammatory agents for his respiratory tract.  He will also continue to address reflux with the therapy noted above.  I will see him back in this clinic in 6 months or earlier if there is a problem.  Allena Katz, MD Allergy / Immunology [place]

## 2022-09-06 NOTE — Patient Instructions (Addendum)
  1.  Allergen avoidance measures to pollen, cat, dog, dust mites, peanuts egg, and tree nuts except almonds  2.  Continue to treat and prevent inflammation:  A. Dupilumab injections every week B. Symbicort 160-2 puffs 1-2 times a day with a spacer C. Flonase 1-2 sprays in each nostril 1-2 times a day D. Immunotherapy  3. Continue Protonix '40mg'$  -  2 times per day + famotidine 40 mg in evening  4. If needed:   A. Albuterol HFA - 2 inhalations every 4-6 hours  B. Cetirizine 10 mg once a day for a runny nose  C. EpiPen  D. Nasal saline rinses  5. Return to clinic in 6 months or earlier if problem

## 2022-09-07 ENCOUNTER — Encounter: Payer: Self-pay | Admitting: Allergy and Immunology

## 2022-09-07 ENCOUNTER — Other Ambulatory Visit (HOSPITAL_COMMUNITY): Payer: Self-pay

## 2022-09-13 ENCOUNTER — Ambulatory Visit (INDEPENDENT_AMBULATORY_CARE_PROVIDER_SITE_OTHER): Payer: Medicaid Other | Admitting: *Deleted

## 2022-09-13 DIAGNOSIS — J309 Allergic rhinitis, unspecified: Secondary | ICD-10-CM

## 2022-09-20 ENCOUNTER — Ambulatory Visit (INDEPENDENT_AMBULATORY_CARE_PROVIDER_SITE_OTHER): Payer: Medicaid Other

## 2022-09-20 DIAGNOSIS — J309 Allergic rhinitis, unspecified: Secondary | ICD-10-CM

## 2022-09-27 ENCOUNTER — Other Ambulatory Visit (HOSPITAL_COMMUNITY): Payer: Self-pay

## 2022-10-02 ENCOUNTER — Other Ambulatory Visit (HOSPITAL_COMMUNITY): Payer: Self-pay

## 2022-10-03 ENCOUNTER — Other Ambulatory Visit (HOSPITAL_COMMUNITY): Payer: Self-pay

## 2022-10-03 ENCOUNTER — Other Ambulatory Visit: Payer: Self-pay

## 2022-10-03 ENCOUNTER — Ambulatory Visit (INDEPENDENT_AMBULATORY_CARE_PROVIDER_SITE_OTHER): Payer: Medicaid Other

## 2022-10-03 DIAGNOSIS — J309 Allergic rhinitis, unspecified: Secondary | ICD-10-CM | POA: Diagnosis not present

## 2022-10-09 ENCOUNTER — Ambulatory Visit (INDEPENDENT_AMBULATORY_CARE_PROVIDER_SITE_OTHER): Payer: Medicaid Other | Admitting: *Deleted

## 2022-10-09 DIAGNOSIS — J309 Allergic rhinitis, unspecified: Secondary | ICD-10-CM | POA: Diagnosis not present

## 2022-10-10 ENCOUNTER — Encounter: Payer: Self-pay | Admitting: Cardiology

## 2022-10-10 ENCOUNTER — Ambulatory Visit: Payer: Medicaid Other | Attending: Cardiology | Admitting: Cardiology

## 2022-10-10 VITALS — BP 124/84 | HR 89 | Ht 76.0 in | Wt 266.0 lb

## 2022-10-10 DIAGNOSIS — R0609 Other forms of dyspnea: Secondary | ICD-10-CM | POA: Diagnosis not present

## 2022-10-10 DIAGNOSIS — E785 Hyperlipidemia, unspecified: Secondary | ICD-10-CM | POA: Diagnosis not present

## 2022-10-10 DIAGNOSIS — R002 Palpitations: Secondary | ICD-10-CM

## 2022-10-10 NOTE — Progress Notes (Signed)
Cardiology Office Note:    Date:  10/10/2022   ID:  Bob Roy, DOB 05-Jun-2001, MRN [id number]  PCP:  Bob FusiSchultz, Douglas E, MD  Cardiologist:  Bob Balsamobert Asa Fath, MD    Referring MD: Bob FusiSchultz, Douglas E, MD   Chief Complaint  Patient presents with   Follow-up  Doing much better  History of Present Illness:    Bob Roy is a 22 y.o. male came to us because of palpitations, he also got depression anxiety GERD essential hypertension. Evaluation included echocardiogram which surprisingly showed quite significant LVH with wall thickness of 1.35 cm, he is monitored that he wear show no arrhythmia.  Comes today to months for follow-up just few days ago he got hip surgery he feels dramatically better from pain point review.  Denies have any chest pain tightness squeezing pressure burning chest gradually started doing a little more exercises.  Past Medical History:  Diagnosis Date   Anxiety    Arthritis    Asthma    Depression    Enlarged prostate    Eosinophilic esophagitis    GERD (gastroesophageal reflux disease)    HTN (hypertension)    Obesity    Seasonal allergies     Past Surgical History:  Procedure Laterality Date   ANKLE SURGERY Right    bilateral wrist surgery Bilateral    CIRCUMCISION     KNEE ARTHROSCOPY Left    NASAL SEPTUM SURGERY Bilateral 02/23/2021   Radial fracture surgery Right    Right hip surgery     VENTRAL HERNIA REPAIR     WRIST ARTHROSCOPY Right     Current Medications: Current Meds  Medication Sig   albuterol (PROAIR HFA) 108 (90 Base) MCG/ACT inhaler Can inhale two puffs every four to six hours as needed for cough or wheeze. (Patient taking differently: Inhale 2 puffs into the lungs every 6 (six) hours as needed for wheezing or shortness of breath. Can inhale two puffs every four to six hours as needed for cough or wheeze.)   celecoxib (CELEBREX) 200 MG capsule Take 200 mg by mouth daily.   cetirizine (ZYRTEC) 10 MG tablet TAKE 1  TABLET BY MOUTH ONCE DAILY AS NEEDED FOR ALLERGIES (Patient taking differently: Take 10 mg by mouth daily as needed for allergies. TAKE 1 TABLET BY MOUTH ONCE DAILY AS NEEDED FOR ALLERGIES)   cyclobenzaprine (FLEXERIL) 5 MG tablet Take 5 mg by mouth 3 (three) times daily as needed for muscle spasms.   diclofenac (FLECTOR) 1.3 % PTCH Place 1 patch onto the skin 2 (two) times daily.   DUPIXENT 300 MG/2ML prefilled syringe Inject 300 mg into the skin every 7 (seven) days.   EPIPEN 2-PAK 0.3 MG/0.3ML SOAJ injection Use as directed for life threatening allergic reactions (Patient taking differently: Inject 0.3 mg into the muscle as needed for anaphylaxis. Use as directed for life threatening allergic reactions)   famotidine (PEPCID) 20 MG tablet Take 1 tablet (20 mg total) by mouth daily.   fluticasone (FLONASE) 50 MCG/ACT nasal spray USE 1 TO 2 SPRAY(S) IN EACH NOSTRIL ONCE DAILY (Patient taking differently: Place 2 sprays into both nostrils daily. USE 1 TO 2 SPRAY(S) IN EACH NOSTRIL ONCE DAILY)   gabapentin (NEURONTIN) 300 MG capsule Take 1 capsule by mouth 3 (three) times daily.   mirtazapine (REMERON SOL-TAB) 45 MG disintegrating tablet Take 45 mg by mouth at bedtime.   OLANZapine (ZYPREXA) 5 MG tablet Take 5 mg by mouth daily.   pantoprazole (PROTONIX) 40 MG tablet Take  1 tablet (40 mg total) by mouth 2 (two) times daily.   Spacer/Aero-Holding [employer] Use with inhaler as directed. (Patient taking differently: 1 each by Other route See admin instructions. Use with inhaler as directed.)   SYMBICORT 160-4.5 MCG/ACT inhaler Inhale two puffs with spacer twice daily to prevent cough or wheeze.  Rinse, gargle, and spit after use. (Patient taking differently: Inhale 2 puffs into the lungs as needed (cough or wheezing). Inhale two puffs with spacer twice daily to prevent cough or wheeze.  Rinse, gargle, and spit after use.)   tamsulosin (FLOMAX) 0.4 MG CAPS capsule Take 0.4 mg by mouth at bedtime.    traMADol (ULTRAM) 50 MG tablet Take 50 mg by mouth 4 (four) times daily as needed for moderate pain or severe pain.   traZODone (DESYREL) 100 MG tablet Take 100 mg by mouth at bedtime.   venlafaxine XR (EFFEXOR-XR) 150 MG 24 hr capsule Take by mouth.     Allergies:   Clavulanic acid, Gluten meal, Misc. sulfonamide containing compounds, Tilactase, Amoxicillin, Augmentin [amoxicillin-pot clavulanate], Elemental sulfur, and Sulfa antibiotics   Social History   Socioeconomic History   Marital status: Single    Spouse name: Not on file   Number of children: 0   Years of education: Not on file   Highest education level: Not on file  Occupational History   Occupation: student  Tobacco Use   Smoking status: Former    Types: Cigarettes    Quit date: 2020    Years since quitting: 4.2   Smokeless tobacco: Former    Types: Chew    Quit date: 2021  Vaping Use   Vaping Use: Never used  Substance and Sexual Activity   Alcohol use: Not Currently    Comment: occasional   Drug use: No   Sexual activity: Never    Birth control/protection: Abstinence  Other Topics Concern   Not on file  Social History Narrative   Not on file   Social Determinants of Health   Financial Resource Strain: Not on file  Food Insecurity: Not on file  Transportation Needs: Not on file  Physical Activity: Not on file  Stress: Not on file  Social Connections: Not on file     Family History: The patient's family history includes Anxiety disorder in his mother; Asthma in his mother; Breast cancer in his maternal grandmother and mother; Cancer in his father and paternal grandmother; Clotting disorder in his mother; Depression in his mother; Diabetes in his father; Epilepsy in his cousin; Irritable bowel syndrome in his father; Kidney disease in his paternal grandfather; Migraines in his mother; Prostate cancer in his paternal grandfather; Ulcerative colitis in his maternal grandmother. There is no history of Colon  cancer, Esophageal cancer, Stomach cancer, or Rectal cancer. ROS:   Please see the history of present illness.    All 14 point review of systems negative except as described per history of present illness  EKGs/Labs/Other Studies Reviewed:      Recent Labs: No results found for requested labs within last 365 days.  Recent Lipid Panel    Component Value Date/Time   CHOL 205 (H) 12/26/2021 1535   TRIG 162 (H) 12/26/2021 1535   HDL 45 12/26/2021 1535   CHOLHDL 4.6 12/26/2021 1535   LDLCALC 131 (H) 12/26/2021 1535    Physical Exam:    VS:  BP 124/84 (BP Location: Left Arm, Patient Position: Sitting)   Pulse 89   Ht 6\' 4"  (1.93 m)  Wt 266 lb (120.7 kg)   SpO2 96%   BMI 32.38 kg/m     Wt Readings from Last 3 Encounters:  10/10/22 266 lb (120.7 kg)  09/06/22 265 lb 6.4 oz (120.4 kg)  12/26/21 296 lb 6.4 oz (134.4 kg)     GEN:  Well nourished, well developed in no acute distress HEENT: Normal NECK: No JVD; No carotid bruits LYMPHATICS: No lymphadenopathy CARDIAC: RRR, no murmurs, no rubs, no gallops RESPIRATORY:  Clear to auscultation without rales, wheezing or rhonchi  ABDOMEN: Soft, non-tender, non-distended MUSCULOSKELETAL:  No edema; No deformity  SKIN: Warm and dry LOWER EXTREMITIES: no swelling NEUROLOGIC:  Alert and oriented x 3 PSYCHIATRIC:  Normal affect   ASSESSMENT:    1. Dyslipidemia   2. Dyspnea on exertion   3. Palpitations    PLAN:    In order of problems listed above:  Palpitations which was initial presentation is gone.  Doing quite well from that point review. Dyspnea on exertion difficult to judge because he had some hip problem but started exercising on the regular basis will see how he does Dyslipidemia I did review his K PN which show me his LDL of 114 HDL 37 we have the stage of diet and exercise which I encouraged him to do. Left ventricle hypertrophy noted on the echocardiogram which is surprising he does have history of hypertension  but today his blood pressure is very good.  I will repeat his echocardiogram of course somewhat about hypertrophic cardiomyopathy.   Medication Adjustments/Labs and Tests Ordered: Current medicines are reviewed at length with the patient today.  Concerns regarding medicines are outlined above.  No orders of the defined types were placed in this encounter.  Medication changes: No orders of the defined types were placed in this encounter.   Signed, Georgeanna Lea, MD, [facility] 10/10/2022 9:36 AM    [facility]

## 2022-10-10 NOTE — Patient Instructions (Signed)
Medication Instructions:  Your physician recommends that you continue on your current medications as directed. Please refer to the Current Medication list given to you today.  *If you need a refill on your cardiac medications before your next appointment, please call your pharmacy*   Lab Work: None Ordered If you have labs (blood work) drawn today and your tests are completely normal, you will receive your results only by: MyChart Message (if you have MyChart) OR A paper copy in the mail If you have any lab test that is abnormal or we need to change your treatment, we will call you to review the results.   Testing/Procedures: Your physician has requested that you have an echocardiogram. Echocardiography is a painless test that uses sound waves to create images of your heart. It provides your doctor with information about the size and shape of your heart and how well your heart's chambers and valves are working. This procedure takes approximately one hour. There are no restrictions for this procedure. Please do NOT wear cologne, perfume, aftershave, or lotions (deodorant is allowed). Please arrive 15 minutes prior to your appointment time.    Follow-Up: At [facility], you and your health needs are our priority.  As part of our continuing mission to provide you with exceptional heart care, we have created designated Provider Care Teams.  These Care Teams include your primary Cardiologist (physician) and Advanced Practice Providers (APPs -  Physician Assistants and Nurse Practitioners) who all work together to provide you with the care you need, when you need it.  We recommend signing up for the patient portal called "MyChart".  Sign up information is provided on this After Visit Summary.  MyChart is used to connect with patients for Virtual Visits (Telemedicine).  Patients are able to view lab/test results, encounter notes, upcoming appointments, etc.  Non-urgent messages can be sent to your  provider as well.   To learn more about what you can do with MyChart, go to [email or web address].    Your next appointment:   3 month(s)  The format for your next appointment:   In Person  Provider:   Robert Krasowski, MD    Other Instructions NA  

## 2022-10-10 NOTE — Addendum Note (Signed)
Addended by: Baldo Ash D on: 10/10/2022 09:57 AM   Modules accepted: Orders

## 2022-10-13 DIAGNOSIS — M24132 Other articular cartilage disorders, left wrist: Secondary | ICD-10-CM | POA: Insufficient documentation

## 2022-10-20 ENCOUNTER — Other Ambulatory Visit: Payer: Self-pay | Admitting: Allergy and Immunology

## 2022-10-20 DIAGNOSIS — Z9889 Other specified postprocedural states: Secondary | ICD-10-CM | POA: Insufficient documentation

## 2022-10-24 ENCOUNTER — Ambulatory Visit: Payer: Medicaid Other

## 2022-10-24 DIAGNOSIS — J3089 Other allergic rhinitis: Secondary | ICD-10-CM

## 2022-10-24 NOTE — Progress Notes (Signed)
VIALS EXP 10-24-23 

## 2022-10-25 ENCOUNTER — Ambulatory Visit (INDEPENDENT_AMBULATORY_CARE_PROVIDER_SITE_OTHER): Payer: Medicaid Other | Admitting: *Deleted

## 2022-10-25 DIAGNOSIS — J309 Allergic rhinitis, unspecified: Secondary | ICD-10-CM

## 2022-10-26 DIAGNOSIS — J3081 Allergic rhinitis due to animal (cat) (dog) hair and dander: Secondary | ICD-10-CM | POA: Diagnosis not present

## 2022-10-27 ENCOUNTER — Other Ambulatory Visit (HOSPITAL_COMMUNITY): Payer: Self-pay

## 2022-11-08 ENCOUNTER — Ambulatory Visit (INDEPENDENT_AMBULATORY_CARE_PROVIDER_SITE_OTHER): Payer: Medicaid Other | Admitting: *Deleted

## 2022-11-08 DIAGNOSIS — J309 Allergic rhinitis, unspecified: Secondary | ICD-10-CM | POA: Diagnosis not present

## 2022-11-15 ENCOUNTER — Other Ambulatory Visit (HOSPITAL_COMMUNITY): Payer: Self-pay

## 2022-11-16 ENCOUNTER — Other Ambulatory Visit (HOSPITAL_COMMUNITY): Payer: Self-pay

## 2022-11-20 ENCOUNTER — Other Ambulatory Visit (HOSPITAL_COMMUNITY): Payer: Self-pay

## 2022-11-21 ENCOUNTER — Other Ambulatory Visit (HOSPITAL_COMMUNITY): Payer: Self-pay

## 2022-11-23 ENCOUNTER — Ambulatory Visit (INDEPENDENT_AMBULATORY_CARE_PROVIDER_SITE_OTHER): Payer: Medicaid Other | Admitting: *Deleted

## 2022-11-23 DIAGNOSIS — J309 Allergic rhinitis, unspecified: Secondary | ICD-10-CM

## 2022-11-29 ENCOUNTER — Ambulatory Visit: Payer: Medicaid Other | Attending: Cardiology

## 2022-11-29 DIAGNOSIS — R0609 Other forms of dyspnea: Secondary | ICD-10-CM | POA: Diagnosis not present

## 2022-11-29 MED ORDER — PERFLUTREN LIPID MICROSPHERE
1.0000 mL | INTRAVENOUS | Status: AC | PRN
Start: 2022-11-29 — End: 2022-11-29
  Administered 2022-11-29: 6 mL via INTRAVENOUS

## 2022-12-04 ENCOUNTER — Encounter: Payer: Self-pay | Admitting: Cardiology

## 2022-12-04 ENCOUNTER — Ambulatory Visit (INDEPENDENT_AMBULATORY_CARE_PROVIDER_SITE_OTHER): Payer: Medicaid Other | Admitting: *Deleted

## 2022-12-04 DIAGNOSIS — J309 Allergic rhinitis, unspecified: Secondary | ICD-10-CM

## 2022-12-05 LAB — ECHOCARDIOGRAM COMPLETE
Area-P 1/2: 4.89 cm2
S' Lateral: 3.75 cm

## 2022-12-12 ENCOUNTER — Other Ambulatory Visit (HOSPITAL_COMMUNITY): Payer: Self-pay

## 2022-12-13 ENCOUNTER — Other Ambulatory Visit (HOSPITAL_COMMUNITY): Payer: Self-pay

## 2022-12-20 ENCOUNTER — Ambulatory Visit (INDEPENDENT_AMBULATORY_CARE_PROVIDER_SITE_OTHER): Payer: Medicaid Other | Admitting: *Deleted

## 2022-12-20 ENCOUNTER — Telehealth: Payer: Self-pay

## 2022-12-20 DIAGNOSIS — J309 Allergic rhinitis, unspecified: Secondary | ICD-10-CM

## 2022-12-20 NOTE — Telephone Encounter (Signed)
Left message on My Chart with normal results per Dr. Krasowski's note. Routed to PCP. 

## 2022-12-25 ENCOUNTER — Ambulatory Visit (INDEPENDENT_AMBULATORY_CARE_PROVIDER_SITE_OTHER): Payer: Medicaid Other | Admitting: *Deleted

## 2022-12-25 DIAGNOSIS — J309 Allergic rhinitis, unspecified: Secondary | ICD-10-CM | POA: Diagnosis not present

## 2023-01-01 ENCOUNTER — Ambulatory Visit (INDEPENDENT_AMBULATORY_CARE_PROVIDER_SITE_OTHER): Payer: Medicaid Other | Admitting: *Deleted

## 2023-01-01 DIAGNOSIS — J309 Allergic rhinitis, unspecified: Secondary | ICD-10-CM

## 2023-01-05 ENCOUNTER — Other Ambulatory Visit (HOSPITAL_COMMUNITY): Payer: Self-pay

## 2023-01-08 ENCOUNTER — Ambulatory Visit (INDEPENDENT_AMBULATORY_CARE_PROVIDER_SITE_OTHER): Payer: Medicaid Other | Admitting: *Deleted

## 2023-01-08 DIAGNOSIS — J309 Allergic rhinitis, unspecified: Secondary | ICD-10-CM | POA: Diagnosis not present

## 2023-01-09 ENCOUNTER — Other Ambulatory Visit (HOSPITAL_COMMUNITY): Payer: Self-pay

## 2023-01-15 ENCOUNTER — Ambulatory Visit (INDEPENDENT_AMBULATORY_CARE_PROVIDER_SITE_OTHER): Payer: Medicaid Other | Admitting: *Deleted

## 2023-01-15 DIAGNOSIS — J309 Allergic rhinitis, unspecified: Secondary | ICD-10-CM | POA: Diagnosis not present

## 2023-01-17 ENCOUNTER — Other Ambulatory Visit: Payer: Self-pay

## 2023-01-17 ENCOUNTER — Encounter: Payer: Self-pay | Admitting: Allergy and Immunology

## 2023-01-17 ENCOUNTER — Other Ambulatory Visit: Payer: Self-pay | Admitting: Allergy and Immunology

## 2023-01-17 MED ORDER — EPIPEN 2-PAK 0.3 MG/0.3ML IJ SOAJ: INTRAMUSCULAR | 3 refills | Status: DC

## 2023-01-18 ENCOUNTER — Ambulatory Visit: Payer: Medicaid Other | Admitting: Cardiology

## 2023-01-25 DIAGNOSIS — Z01818 Encounter for other preprocedural examination: Secondary | ICD-10-CM | POA: Insufficient documentation

## 2023-01-25 DIAGNOSIS — M25371 Other instability, right ankle: Secondary | ICD-10-CM | POA: Insufficient documentation

## 2023-01-29 ENCOUNTER — Ambulatory Visit (INDEPENDENT_AMBULATORY_CARE_PROVIDER_SITE_OTHER): Payer: Medicaid Other | Admitting: *Deleted

## 2023-01-29 DIAGNOSIS — J309 Allergic rhinitis, unspecified: Secondary | ICD-10-CM | POA: Diagnosis not present

## 2023-01-31 ENCOUNTER — Other Ambulatory Visit: Payer: Self-pay

## 2023-02-05 ENCOUNTER — Other Ambulatory Visit (HOSPITAL_COMMUNITY): Payer: Self-pay

## 2023-02-12 ENCOUNTER — Ambulatory Visit (INDEPENDENT_AMBULATORY_CARE_PROVIDER_SITE_OTHER): Payer: Medicaid Other | Admitting: *Deleted

## 2023-02-12 DIAGNOSIS — J309 Allergic rhinitis, unspecified: Secondary | ICD-10-CM | POA: Diagnosis not present

## 2023-02-21 DIAGNOSIS — J3089 Other allergic rhinitis: Secondary | ICD-10-CM

## 2023-02-21 NOTE — Progress Notes (Signed)
VIALS EXP 02-21-24

## 2023-02-22 DIAGNOSIS — J3081 Allergic rhinitis due to animal (cat) (dog) hair and dander: Secondary | ICD-10-CM

## 2023-02-27 ENCOUNTER — Other Ambulatory Visit: Payer: Self-pay | Admitting: Family Medicine

## 2023-02-27 ENCOUNTER — Other Ambulatory Visit: Payer: Self-pay

## 2023-02-28 ENCOUNTER — Encounter: Payer: Self-pay | Admitting: Cardiology

## 2023-02-28 ENCOUNTER — Ambulatory Visit: Payer: Medicaid Other | Attending: Cardiology | Admitting: Cardiology

## 2023-02-28 ENCOUNTER — Ambulatory Visit (INDEPENDENT_AMBULATORY_CARE_PROVIDER_SITE_OTHER): Payer: Medicaid Other | Admitting: *Deleted

## 2023-02-28 VITALS — BP 142/90 | HR 78 | Ht 76.0 in | Wt 265.0 lb

## 2023-02-28 DIAGNOSIS — J309 Allergic rhinitis, unspecified: Secondary | ICD-10-CM | POA: Diagnosis not present

## 2023-02-28 DIAGNOSIS — E785 Hyperlipidemia, unspecified: Secondary | ICD-10-CM | POA: Diagnosis not present

## 2023-02-28 DIAGNOSIS — R002 Palpitations: Secondary | ICD-10-CM | POA: Diagnosis not present

## 2023-02-28 DIAGNOSIS — I10 Essential (primary) hypertension: Secondary | ICD-10-CM | POA: Insufficient documentation

## 2023-02-28 MED ORDER — LOSARTAN POTASSIUM 25 MG PO TABS: 25.0000 mg | ORAL_TABLET | Freq: Every day | ORAL | 3 refills | Status: DC

## 2023-02-28 NOTE — Patient Instructions (Addendum)
Medication Instructions:   START: Losartan 25mg  1 tablet daily   Lab Work: Your physician recommends that you return for lab work in: 1 week You need to have labs done when you are fasting.  You can come Monday through Friday 8:30 am to 12:00 pm and 1:15 to 4:30. You do not need to make an appointment as the order has already been placed. The labs you are going to have done are BMET,  Lipids.    Testing/Procedures: None Ordered   Follow-Up: At [facility], you and your health needs are our priority.  As part of our continuing mission to provide you with exceptional heart care, we have created designated Provider Care Teams.  These Care Teams include your primary Cardiologist (physician) and Advanced Practice Providers (APPs -  Physician Assistants and Nurse Practitioners) who all work together to provide you with the care you need, when you need it.  We recommend signing up for the patient portal called "MyChart".  Sign up information is provided on this After Visit Summary.  MyChart is used to connect with patients for Virtual Visits (Telemedicine).  Patients are able to view lab/test results, encounter notes, upcoming appointments, etc.  Non-urgent messages can be sent to your provider as well.   To learn more about what you can do with MyChart, go to [email or web address].    Your next appointment:   12 month(s)  The format for your next appointment:   In Person  Provider:   Gypsy Balsam, MD    Other Instructions NA

## 2023-02-28 NOTE — Progress Notes (Unsigned)
Cardiology Office Note:    Date:  02/28/2023   ID:  Bob Roy, DOB 2000-09-07, MRN [id number]  PCP:  Paulina Fusi, MD  Cardiologist:  Gypsy Balsam, MD    Referring MD: Paulina Fusi, MD   Chief Complaint  Patient presents with   Follow-up    History of Present Illness:    Bob Roy is a 22 y.o. male past medical history significant for palpitations, depression, anxiety, GERD, essential hypertension.  He was sent to [place] because of palpitations monitor did not show any significant arrhythmia, he did have echocardiogram done and does show surprisingly quite significant increased thickening of the wall of the heart wall thickness was 1.35 cm.  Echocardiograms been repeated recently there is no increasing thickening he comes today 2 months for follow-up overall doing well.  He denies have any chest pain tightness squeezing pressure burning chest.  He did have surgery done on the right foot and recovering from it.  But overall seems to be doing well  Past Medical History:  Diagnosis Date   Anxiety    Arthritis    Asthma    Depression    Enlarged prostate    Eosinophilic esophagitis    GERD (gastroesophageal reflux disease)    HTN (hypertension)    Obesity    Seasonal allergies     Past Surgical History:  Procedure Laterality Date   ANKLE SURGERY Right    bilateral wrist surgery Bilateral    CIRCUMCISION     KNEE ARTHROSCOPY Left    NASAL SEPTUM SURGERY Bilateral 02/23/2021   Radial fracture surgery Right    Right hip surgery     VENTRAL HERNIA REPAIR     WRIST ARTHROSCOPY Right     Current Medications: Current Meds  Medication Sig   albuterol (PROAIR HFA) 108 (90 Base) MCG/ACT inhaler Can inhale two puffs every four to six hours as needed for cough or wheeze. (Patient taking differently: Inhale 2 puffs into the lungs every 6 (six) hours as needed for wheezing or shortness of breath. Can inhale two puffs every four to six hours as needed  for cough or wheeze.)   celecoxib (CELEBREX) 200 MG capsule Take 200 mg by mouth daily.   cetirizine (ZYRTEC) 10 MG tablet TAKE 1 TABLET BY MOUTH ONCE DAILY AS NEEDED FOR ALLERGIES (Patient taking differently: Take 10 mg by mouth See admin instructions. TAKE 1 TABLET BY MOUTH ONCE DAILY AS NEEDED FOR ALLERGIES)   cyclobenzaprine (FLEXERIL) 5 MG tablet Take 5 mg by mouth 3 (three) times daily as needed for muscle spasms.   DUPIXENT 300 MG/2ML prefilled syringe Inject 300 mg into the skin every 7 (seven) days.   EPINEPHrine (EPIPEN 2-PAK) 0.3 mg/0.3 mL IJ SOAJ injection Use as directed for life-threatening allergic reaction. (Patient taking differently: Inject 0.3 mg into the muscle as needed for anaphylaxis (see below). Use as directed for life-threatening allergic reaction.)   famotidine (PEPCID) 20 MG tablet Take 1 tablet (20 mg total) by mouth daily.   fluticasone (FLONASE) 50 MCG/ACT nasal spray USE 1 TO 2 SPRAY(S) IN EACH NOSTRIL ONCE DAILY (Patient taking differently: Place 2 sprays into both nostrils daily. USE 1 TO 2 SPRAY(S) IN EACH NOSTRIL ONCE DAILY)   gabapentin (NEURONTIN) 300 MG capsule Take 1 capsule by mouth 3 (three) times daily.   mirtazapine (REMERON SOL-TAB) 45 MG disintegrating tablet Take 45 mg by mouth at bedtime.   OLANZapine (ZYPREXA) 5 MG tablet Take 5 mg by mouth daily.  pantoprazole (PROTONIX) 40 MG tablet Take 1 tablet (40 mg total) by mouth 2 (two) times daily.   Spacer/Aero-Holding [employer] Use with inhaler as directed. (Patient taking differently: 1 each by Other route See admin instructions. Use with inhaler as directed.)   SYMBICORT 160-4.5 MCG/ACT inhaler Inhale two puffs with spacer twice daily to prevent cough or wheeze.  Rinse, gargle, and spit after use. (Patient taking differently: Inhale 2 puffs into the lungs as needed (cough or wheezing). Inhale two puffs with spacer twice daily to prevent cough or wheeze.  Rinse, gargle, and spit after use.)    venlafaxine XR (EFFEXOR-XR) 150 MG 24 hr capsule Take 150 mg by mouth daily with breakfast.   [DISCONTINUED] diclofenac (FLECTOR) 1.3 % PTCH Place 1 patch onto the skin 2 (two) times daily.   [DISCONTINUED] tamsulosin (FLOMAX) 0.4 MG CAPS capsule Take 0.4 mg by mouth at bedtime.   [DISCONTINUED] traMADol (ULTRAM) 50 MG tablet Take 50 mg by mouth 4 (four) times daily as needed for moderate pain or severe pain.   [DISCONTINUED] traZODone (DESYREL) 100 MG tablet Take 100 mg by mouth at bedtime.     Allergies:   Clavulanic acid, Gluten meal, Misc. sulfonamide containing compounds, Tilactase, Amoxicillin, Augmentin [amoxicillin-pot clavulanate], Elemental sulfur, and Sulfa antibiotics   Social History   Socioeconomic History   Marital status: Single    Spouse name: Not on file   Number of children: 0   Years of education: Not on file   Highest education level: Not on file  Occupational History   Occupation: student  Tobacco Use   Smoking status: Former    Current packs/day: 0.00    Types: Cigarettes    Quit date: 2020    Years since quitting: 4.6   Smokeless tobacco: Former    Types: Chew    Quit date: 2021  Vaping Use   Vaping status: Never Used  Substance and Sexual Activity   Alcohol use: Not Currently    Comment: occasional   Drug use: No   Sexual activity: Never    Birth control/protection: Abstinence  Other Topics Concern   Not on file  Social History Narrative   Not on file   Social Determinants of Health   Financial Resource Strain: Not on file  Food Insecurity: Not on file  Transportation Needs: Not on file  Physical Activity: Not on file  Stress: Not on file  Social Connections: Not on file     Family History: The patient's family history includes Anxiety disorder in his mother; Asthma in his mother; Breast cancer in his maternal grandmother and mother; Cancer in his father and paternal grandmother; Clotting disorder in his mother; Depression in his mother;  Diabetes in his father; Epilepsy in his cousin; Irritable bowel syndrome in his father; Kidney disease in his paternal grandfather; Migraines in his mother; Prostate cancer in his paternal grandfather; Ulcerative colitis in his maternal grandmother. There is no history of Colon cancer, Esophageal cancer, Stomach cancer, or Rectal cancer. ROS:   Please see the history of present illness.    All 14 point review of systems negative except as described per history of present illness  EKGs/Labs/Other Studies Reviewed:    EKG Interpretation Date/Time:  Wednesday February 28 2023 13:57:24 EDT Ventricular Rate:  76 PR Interval:  160 QRS Duration:  92 QT Interval:  362 QTC Calculation: 407 R Axis:   77  Text Interpretation: Normal sinus rhythm with sinus arrhythmia Normal ECG No previous ECGs available Confirmed by  Gypsy Balsam [phone]) on 02/28/2023 2:06:08 PM    Recent Labs: No results found for requested labs within last 365 days.  Recent Lipid Panel    Component Value Date/Time   CHOL 205 (H) 12/26/2021 1535   TRIG 162 (H) 12/26/2021 1535   HDL 45 12/26/2021 1535   CHOLHDL 4.6 12/26/2021 1535   LDLCALC 131 (H) 12/26/2021 1535    Physical Exam:    VS:  BP (!) 142/90 (BP Location: Left Arm, Patient Position: Sitting)   Pulse 78   Ht 6\' 4"  (1.93 m)   Wt 265 lb (120.2 kg)   SpO2 93%   BMI 32.26 kg/m     Wt Readings from Last 3 Encounters:  02/28/23 265 lb (120.2 kg)  10/10/22 266 lb (120.7 kg)  09/06/22 265 lb 6.4 oz (120.4 kg)     GEN:  Well nourished, well developed in no acute distress HEENT: Normal NECK: No JVD; No carotid bruits LYMPHATICS: No lymphadenopathy CARDIAC: RRR, no murmurs, no rubs, no gallops RESPIRATORY:  Clear to auscultation without rales, wheezing or rhonchi  ABDOMEN: Soft, non-tender, non-distended MUSCULOSKELETAL:  No edema; No deformity  SKIN: Warm and dry LOWER EXTREMITIES: no swelling NEUROLOGIC:  Alert and oriented x 3 PSYCHIATRIC:  Normal  affect   ASSESSMENT:    1. Palpitations   2. Dyslipidemia   3. Essential hypertension    PLAN:    In order of problems listed above:  Palpitations: Denies having any will continue monitoring. Dyslipidemia will recheck his fasting the profile last numbers from January which show LDL of 114 HDL 37 he wanted me to check his cholesterol which we will do. Essential hypertension blood pressure still not controlled.  I will start him small dose of ARB only 25 mg daily I check Chem-7 next week.   Medication Adjustments/Labs and Tests Ordered: Current medicines are reviewed at length with the patient today.  Concerns regarding medicines are outlined above.  Orders Placed This Encounter  Procedures   EKG 12-Lead   Medication changes: No orders of the defined types were placed in this encounter.   Signed, Georgeanna Lea, MD, [facility] 02/28/2023 2:16 PM    [facility]

## 2023-03-01 MED ORDER — DUPIXENT 300 MG/2ML ~~LOC~~ SOSY
300.0000 mg | PREFILLED_SYRINGE | SUBCUTANEOUS | 11 refills | Status: DC
  Filled 2023-03-01: qty 8, 28d supply, fill #0
  Filled 2023-03-21: qty 8, 28d supply, fill #1
  Filled 2023-04-16: qty 8, 28d supply, fill #2
  Filled 2023-05-11: qty 8, 28d supply, fill #3
  Filled 2023-06-05: qty 8, 28d supply, fill #4
  Filled 2023-07-09: qty 8, 28d supply, fill #5
  Filled 2023-08-09: qty 8, 28d supply, fill #6
  Filled 2023-09-04: qty 8, 28d supply, fill #7
  Filled 2023-10-02: qty 8, 28d supply, fill #8
  Filled 2023-10-31: qty 8, 28d supply, fill #9
  Filled 2023-11-27: qty 8, 28d supply, fill #10
  Filled 2023-12-20: qty 8, 28d supply, fill #11

## 2023-03-02 ENCOUNTER — Other Ambulatory Visit: Payer: Self-pay

## 2023-03-02 ENCOUNTER — Other Ambulatory Visit (HOSPITAL_COMMUNITY): Payer: Self-pay

## 2023-03-07 ENCOUNTER — Ambulatory Visit: Payer: Medicaid Other | Admitting: Allergy and Immunology

## 2023-03-07 ENCOUNTER — Encounter: Payer: Self-pay | Admitting: Cardiology

## 2023-03-08 ENCOUNTER — Telehealth: Payer: Self-pay | Admitting: Cardiology

## 2023-03-08 ENCOUNTER — Telehealth: Payer: Self-pay

## 2023-03-08 MED ORDER — AMLODIPINE BESYLATE 2.5 MG PO TABS: 2.5000 mg | ORAL_TABLET | Freq: Every day | ORAL | 3 refills | Status: AC

## 2023-03-08 NOTE — Telephone Encounter (Signed)
Stop Losartan due to making pt sick and start Amlodipine 2.5mg  daily per Dr. Bing Matter.

## 2023-03-08 NOTE — Telephone Encounter (Signed)
Pt c/o medication issue:  1. Name of Medication: losartan (COZAAR) 25 MG tablet   2. How are you currently taking this medication (dosage and times per day)? Take 1 tablet (25 mg total) by mouth daily.   3. Are you having a reaction (difficulty breathing--STAT)? No   4. What is your medication issue? Medication makes patient very nauseous and vomiting. Wants to see if he can be put on another medication

## 2023-03-10 LAB — BASIC METABOLIC PANEL
BUN/Creatinine Ratio: 8 — ABNORMAL LOW (ref 9–20)
BUN: 7 mg/dL (ref 6–20)
CO2: 23 mmol/L (ref 20–29)
Calcium: 9.7 mg/dL (ref 8.7–10.2)
Chloride: 102 mmol/L (ref 96–106)
Creatinine, Ser: 0.9 mg/dL (ref 0.76–1.27)
Glucose: 96 mg/dL (ref 70–99)
Potassium: 4.4 mmol/L (ref 3.5–5.2)
Sodium: 142 mmol/L (ref 134–144)
eGFR: 122 mL/min/{1.73_m2} (ref >59)

## 2023-03-10 LAB — LIPID PANEL
Chol/HDL Ratio: 6.6 ratio — ABNORMAL HIGH (ref 0.0–5.0)
Cholesterol, Total: 185 mg/dL (ref 100–199)
HDL: 28 mg/dL — ABNORMAL LOW (ref >39)
LDL Chol Calc (NIH): 128 mg/dL — ABNORMAL HIGH (ref 0–99)
Triglycerides: 159 mg/dL — ABNORMAL HIGH (ref 0–149)
VLDL Cholesterol Cal: 29 mg/dL (ref 5–40)

## 2023-03-12 ENCOUNTER — Ambulatory Visit (INDEPENDENT_AMBULATORY_CARE_PROVIDER_SITE_OTHER): Payer: Medicare Other | Admitting: *Deleted

## 2023-03-12 DIAGNOSIS — J309 Allergic rhinitis, unspecified: Secondary | ICD-10-CM

## 2023-03-20 ENCOUNTER — Telehealth: Payer: Self-pay

## 2023-03-20 NOTE — Telephone Encounter (Signed)
-----   Message from Gypsy Balsam sent at 03/14/2023 12:31 PM EDT ----- Chem-7 is good, continue present management, cholesterol elevated we will talk options during the visit

## 2023-03-20 NOTE — Telephone Encounter (Signed)
Patient notified through my chart.

## 2023-03-21 ENCOUNTER — Other Ambulatory Visit (HOSPITAL_COMMUNITY): Payer: Self-pay

## 2023-03-28 ENCOUNTER — Ambulatory Visit: Payer: Medicare Other | Admitting: Allergy and Immunology

## 2023-03-28 ENCOUNTER — Ambulatory Visit (INDEPENDENT_AMBULATORY_CARE_PROVIDER_SITE_OTHER): Payer: Self-pay | Admitting: *Deleted

## 2023-03-28 DIAGNOSIS — J309 Allergic rhinitis, unspecified: Secondary | ICD-10-CM | POA: Diagnosis not present

## 2023-04-06 ENCOUNTER — Ambulatory Visit: Payer: Medicare Other | Admitting: Internal Medicine

## 2023-04-09 ENCOUNTER — Encounter: Payer: Self-pay | Admitting: Allergy and Immunology

## 2023-04-10 ENCOUNTER — Encounter: Payer: Self-pay | Admitting: Allergy and Immunology

## 2023-04-10 ENCOUNTER — Other Ambulatory Visit: Payer: Self-pay | Admitting: *Deleted

## 2023-04-10 MED ORDER — ALBUTEROL SULFATE HFA 108 (90 BASE) MCG/ACT IN AERS: 2.0000 | INHALATION_SPRAY | Freq: Four times a day (QID) | RESPIRATORY_TRACT | 0 refills | Status: DC | PRN

## 2023-04-10 MED ORDER — SYMBICORT 160-4.5 MCG/ACT IN AERO: INHALATION_SPRAY | RESPIRATORY_TRACT | 0 refills | Status: DC

## 2023-04-12 ENCOUNTER — Other Ambulatory Visit (HOSPITAL_COMMUNITY): Payer: Self-pay

## 2023-04-12 ENCOUNTER — Ambulatory Visit (INDEPENDENT_AMBULATORY_CARE_PROVIDER_SITE_OTHER): Payer: Medicare Other

## 2023-04-12 DIAGNOSIS — J309 Allergic rhinitis, unspecified: Secondary | ICD-10-CM

## 2023-04-16 ENCOUNTER — Other Ambulatory Visit: Payer: Self-pay

## 2023-04-16 ENCOUNTER — Other Ambulatory Visit (HOSPITAL_COMMUNITY): Payer: Self-pay | Admitting: Pharmacy Technician

## 2023-04-16 ENCOUNTER — Other Ambulatory Visit (HOSPITAL_COMMUNITY): Payer: Self-pay

## 2023-04-16 NOTE — Progress Notes (Signed)
Specialty Pharmacy Refill Coordination Note  Bob Roy is a 22 y.o. male contacted today regarding refills of specialty medication(s) Dupilumab   Patient requested Delivery   Delivery date: 04/20/23   Verified address: [address] [place], [address]   Medication will be filled on 04/19/23.

## 2023-04-18 ENCOUNTER — Ambulatory Visit: Payer: Medicare Other | Admitting: Allergy and Immunology

## 2023-04-19 ENCOUNTER — Other Ambulatory Visit: Payer: Self-pay

## 2023-04-19 ENCOUNTER — Other Ambulatory Visit (HOSPITAL_COMMUNITY): Payer: Self-pay

## 2023-04-23 ENCOUNTER — Ambulatory Visit (INDEPENDENT_AMBULATORY_CARE_PROVIDER_SITE_OTHER): Payer: Medicare Other | Admitting: Allergy and Immunology

## 2023-04-23 ENCOUNTER — Encounter: Payer: Self-pay | Admitting: Allergy and Immunology

## 2023-04-23 ENCOUNTER — Other Ambulatory Visit: Payer: Self-pay

## 2023-04-23 ENCOUNTER — Ambulatory Visit: Payer: Self-pay | Admitting: *Deleted

## 2023-04-23 VITALS — BP 126/82 | HR 83 | Resp 16 | Ht 75.0 in | Wt 271.2 lb

## 2023-04-23 DIAGNOSIS — J4540 Moderate persistent asthma, uncomplicated: Secondary | ICD-10-CM | POA: Diagnosis not present

## 2023-04-23 DIAGNOSIS — J3089 Other allergic rhinitis: Secondary | ICD-10-CM

## 2023-04-23 DIAGNOSIS — J309 Allergic rhinitis, unspecified: Secondary | ICD-10-CM

## 2023-04-23 DIAGNOSIS — T7800XA Anaphylactic reaction due to unspecified food, initial encounter: Secondary | ICD-10-CM

## 2023-04-23 DIAGNOSIS — T7800XD Anaphylactic reaction due to unspecified food, subsequent encounter: Secondary | ICD-10-CM

## 2023-04-23 DIAGNOSIS — K219 Gastro-esophageal reflux disease without esophagitis: Secondary | ICD-10-CM

## 2023-04-23 DIAGNOSIS — K200 Eosinophilic esophagitis: Secondary | ICD-10-CM | POA: Diagnosis not present

## 2023-04-23 NOTE — Patient Instructions (Signed)
  1.  Allergen avoidance measures - pollen, cat, dog, dust mites, (peanuts egg, tree nuts ???)  2.  Continue to treat and prevent inflammation:  A. Dupilumab injections every week B. Flonase 1-2 sprays in each nostril 1-2 times a day D. Immunotherapy  3. Continue Protonix 40mg  -  2 times per day + famotidine 40 mg in evening  4. If needed:   A. Symbicort 160-2 puffs 1-2 times a day with a spacer (replaces albuterol)  B. Cetirizine 10 mg once a day for a runny nose  C. EpiPen  D. Nasal saline rinses  5. Return to clinic in 6 months or earlier if problem

## 2023-04-23 NOTE — Progress Notes (Unsigned)
[place] - [place] - [place] - [place] - [place]   Follow-up Note  Referring Provider: Paulina Fusi, MD Primary Provider: Paulina Fusi, MD Date of Office Visit: 04/23/2023  Subjective:   Bob Roy (DOB: [birth year]) is a 22 y.o. male who [facility] in re-evaluation of the following:  HPI: [name] returns to this clinic in evaluation of asthma, allergic rhinitis, EOE, food allergy directed against egg, peanut, tree nut.  I last saw him in this clinic 06 September 2022.  He is using dupilumab on a weekly basis for his EOE and he has done very well and can swallow without any problem.  He still continues to use a proton pump inhibitor and H2 receptor blocker and if he misses 1 of these medicines he gets very bad reflux.  Overall he is very satisfied with the response he is receiving while using dupilumab.  And has had absolutely no issues with his airway.  Has had no issues with his nose and no issues with his chest and has not required a systemic steroid or an antibiotic and rarely uses a short acting bronchodilator and is not really using Symbicort although he does continue to use Flonase on a consistent basis.  He continues on immunotherapy every 2 weeks without any adverse effect.  He has now been able to eat eggs, peanuts, cashews, almonds.  He has received this years flu vaccine.  Approximately 10 days ago he had a tonsillectomy performed for sleep apnea and tonsil stones.  Allergies as of 04/23/2023       Reactions   Clavulanic Acid    Gluten Meal    Misc. Sulfonamide Containing Compounds    Tilactase    Amoxicillin Rash   Augmentin [amoxicillin-pot Clavulanate] Rash   Elemental Sulfur Rash   Sulfa Antibiotics Rash        Medication List    albuterol 108 (90 Base) MCG/ACT inhaler Commonly known as: ProAir HFA Can inhale two puffs every four to six hours as needed for cough or wheeze.   albuterol  108 (90 Base) MCG/ACT inhaler Commonly known as: VENTOLIN HFA Inhale 2 puffs into the lungs every 6 (six) hours as needed for wheezing or shortness of breath.   amLODipine 2.5 MG tablet Commonly known as: NORVASC Take 1 tablet (2.5 mg total) by mouth daily.   celecoxib 200 MG capsule Commonly known as: CELEBREX Take 200 mg by mouth daily.   cetirizine 10 MG tablet Commonly known as: ZYRTEC TAKE 1 TABLET BY MOUTH ONCE DAILY AS NEEDED FOR ALLERGIES   cyclobenzaprine 5 MG tablet Commonly known as: FLEXERIL Take 5 mg by mouth 3 (three) times daily as needed for muscle spasms.   Dupixent 300 MG/2ML prefilled syringe Generic drug: dupilumab Inject 300 mg into the skin every 7 (seven) days.   EPINEPHrine 0.3 mg/0.3 mL Soaj injection Commonly known as: EpiPen 2-Pak Use as directed for life-threatening allergic reaction.   famotidine 20 MG tablet Commonly known as: PEPCID Take 1 tablet (20 mg total) by mouth daily.   fluticasone 50 MCG/ACT nasal spray Commonly known as: FLONASE USE 1 TO 2 SPRAY(S) IN EACH NOSTRIL ONCE DAILY   gabapentin 300 MG capsule Commonly known as: NEURONTIN Take 1 capsule by mouth 3 (three) times daily.   mirtazapine 45 MG disintegrating tablet Commonly known as: REMERON SOL-TAB Take 45 mg by mouth at bedtime.   OLANZapine 5 MG tablet Commonly known as: ZYPREXA Take 5  mg by mouth daily.   pantoprazole 40 MG tablet Commonly known as: PROTONIX Take 1 tablet (40 mg total) by mouth 2 (two) times daily.   Spacer/Aero-Holding [employer] Use with inhaler as directed.   Symbicort 160-4.5 MCG/ACT inhaler Generic drug: budesonide-formoterol Inhale two puffs with spacer twice daily to prevent cough or wheeze.  Rinse, gargle, and spit after use.   venlafaxine XR 150 MG 24 hr capsule Commonly known as: EFFEXOR-XR Take 150 mg by mouth daily with breakfast.    Past Medical History:  Diagnosis Date   Anxiety    Arthritis    Asthma    Depression     Enlarged prostate    Eosinophilic esophagitis    GERD (gastroesophageal reflux disease)    HTN (hypertension)    Obesity    Seasonal allergies     Past Surgical History:  Procedure Laterality Date   ANKLE SURGERY Right    bilateral wrist surgery Bilateral    CIRCUMCISION     KNEE ARTHROSCOPY Left    NASAL SEPTUM SURGERY Bilateral 02/23/2021   Radial fracture surgery Right    Right hip surgery     TONSILLECTOMY     VENTRAL HERNIA REPAIR     WRIST ARTHROSCOPY Right     Review of systems negative except as noted in HPI / PMHx or noted below:  Review of Systems  Constitutional: Negative.   HENT: Negative.    Eyes: Negative.   Respiratory: Negative.    Cardiovascular: Negative.   Gastrointestinal: Negative.   Genitourinary: Negative.   Musculoskeletal: Negative.   Skin: Negative.   Neurological: Negative.   Endo/Heme/Allergies: Negative.   Psychiatric/Behavioral: Negative.       Objective:   Vitals:   04/23/23 0955  BP: 126/82  Pulse: 83  Resp: 16  SpO2: 98%   Height: 6\' 3"  (190.5 cm)  Weight: 271 lb 3.2 oz (123 kg)   Physical Exam Constitutional:      Appearance: He is not diaphoretic.  HENT:     Head: Normocephalic.     Right Ear: Tympanic membrane, ear canal and external ear normal.     Left Ear: Tympanic membrane, ear canal and external ear normal.     Nose: Nose normal. No mucosal edema or rhinorrhea.     Mouth/Throat:     Pharynx: Uvula midline. No oropharyngeal exudate.  Eyes:     Conjunctiva/sclera: Conjunctivae normal.  Neck:     Thyroid: No thyromegaly.     Trachea: Trachea normal. No tracheal tenderness or tracheal deviation.  Cardiovascular:     Rate and Rhythm: Normal rate and regular rhythm.     Heart sounds: Normal heart sounds, S1 normal and S2 normal. No murmur heard. Pulmonary:     Effort: No respiratory distress.     Breath sounds: Normal breath sounds. No stridor. No wheezing or rales.  Lymphadenopathy:     Head:     Right  side of head: No tonsillar adenopathy.     Left side of head: No tonsillar adenopathy.     Cervical: No cervical adenopathy.  Skin:    Findings: No erythema or rash.     Nails: There is no clubbing.  Neurological:     Mental Status: He is alert.     Diagnostics: Spirometry was not performed.  Assessment and Plan:   1. Eosinophilic esophagitis   2. Asthma, moderate persistent, well-controlled   3. Perennial allergic rhinitis   4. Gastroesophageal reflux disease, unspecified whether esophagitis present  5. Allergy with anaphylaxis due to food    1.  Allergen avoidance measures - pollen, cat, dog, dust mites, (peanuts, egg, tree nuts ???)  2.  Continue to treat and prevent inflammation:  A. Dupilumab injections every week B. Flonase 1-2 sprays in each nostril 1-2 times a day D. Immunotherapy  3. Continue Protonix 40mg  -  2 times per day + famotidine 40 mg in evening  4. If needed:   A. Symbicort 160-2 puffs 1-2 times a day with a spacer (replaces albuterol)  B. Cetirizine 10 mg once a day for a runny nose  C. EpiPen  D. Nasal saline rinses  5. Return to clinic in 6 months or earlier if problem  Changa appears to be doing very well while using dupilumab for his EOE and he has absolutely no issues with his airway while using this medication as well.  He can use his Symbicort as a rescue medicine should it be required.  He will continue on dupilumab and immunotherapy.  It seems as though he can now consume egg and peanut and tree nut without much problem and hopefully this is a persistent change in his immune system.  Laurette Schimke, MD Allergy / Immunology [facility]

## 2023-04-24 ENCOUNTER — Other Ambulatory Visit: Payer: Self-pay

## 2023-04-24 ENCOUNTER — Encounter: Payer: Self-pay | Admitting: Allergy and Immunology

## 2023-04-25 ENCOUNTER — Other Ambulatory Visit (HOSPITAL_COMMUNITY): Payer: Self-pay

## 2023-04-30 ENCOUNTER — Other Ambulatory Visit: Payer: Self-pay | Admitting: Allergy and Immunology

## 2023-05-07 ENCOUNTER — Encounter: Payer: Self-pay | Admitting: Allergy and Immunology

## 2023-05-07 ENCOUNTER — Other Ambulatory Visit (HOSPITAL_COMMUNITY): Payer: Self-pay

## 2023-05-09 ENCOUNTER — Ambulatory Visit (INDEPENDENT_AMBULATORY_CARE_PROVIDER_SITE_OTHER): Payer: Self-pay | Admitting: *Deleted

## 2023-05-09 ENCOUNTER — Other Ambulatory Visit: Payer: Self-pay | Admitting: *Deleted

## 2023-05-09 DIAGNOSIS — J309 Allergic rhinitis, unspecified: Secondary | ICD-10-CM | POA: Diagnosis not present

## 2023-05-09 MED ORDER — FLUTICASONE PROPIONATE 50 MCG/ACT NA SUSP: NASAL | 5 refills | Status: DC

## 2023-05-11 ENCOUNTER — Other Ambulatory Visit: Payer: Self-pay

## 2023-05-11 NOTE — Progress Notes (Signed)
Specialty Pharmacy Refill Coordination Note  Bob Roy is a 22 y.o. male contacted today regarding refills of specialty medication(s) Dupilumab   Patient requested Delivery   Delivery date: 05/15/23   Verified address: [address] [place] [address]   Medication will be filled on 05/14/23.

## 2023-05-14 ENCOUNTER — Other Ambulatory Visit: Payer: Self-pay

## 2023-05-14 ENCOUNTER — Ambulatory Visit (INDEPENDENT_AMBULATORY_CARE_PROVIDER_SITE_OTHER): Payer: Self-pay

## 2023-05-14 DIAGNOSIS — J309 Allergic rhinitis, unspecified: Secondary | ICD-10-CM | POA: Diagnosis not present

## 2023-05-14 NOTE — Progress Notes (Signed)
Insurance will not pay for medication until 11/13. Last filled 10/23. Left voice mail for patient. Shipping 11/13 for 11/14.

## 2023-05-16 ENCOUNTER — Other Ambulatory Visit: Payer: Self-pay

## 2023-05-23 ENCOUNTER — Ambulatory Visit (INDEPENDENT_AMBULATORY_CARE_PROVIDER_SITE_OTHER): Payer: Self-pay | Admitting: *Deleted

## 2023-05-23 DIAGNOSIS — J309 Allergic rhinitis, unspecified: Secondary | ICD-10-CM | POA: Diagnosis not present

## 2023-05-28 ENCOUNTER — Ambulatory Visit (INDEPENDENT_AMBULATORY_CARE_PROVIDER_SITE_OTHER): Payer: Medicare Other | Admitting: *Deleted

## 2023-05-28 DIAGNOSIS — J309 Allergic rhinitis, unspecified: Secondary | ICD-10-CM

## 2023-05-29 ENCOUNTER — Other Ambulatory Visit: Payer: Self-pay | Admitting: *Deleted

## 2023-05-29 MED ORDER — SYMBICORT 160-4.5 MCG/ACT IN AERO: INHALATION_SPRAY | RESPIRATORY_TRACT | 0 refills | Status: DC

## 2023-06-01 ENCOUNTER — Other Ambulatory Visit (HOSPITAL_COMMUNITY): Payer: Self-pay

## 2023-06-05 ENCOUNTER — Other Ambulatory Visit: Payer: Self-pay

## 2023-06-05 NOTE — Progress Notes (Signed)
Specialty Pharmacy Refill Coordination Note  Bob Roy is a 22 y.o. male contacted today regarding refills of specialty medication(s) Dupilumab   Patient requested Delivery   Delivery date: 06/12/23   Verified address: [address] [place] [address]   Medication will be filled on 06/11/23.

## 2023-06-11 ENCOUNTER — Other Ambulatory Visit: Payer: Self-pay

## 2023-06-13 ENCOUNTER — Ambulatory Visit: Payer: Medicare Other | Admitting: Allergy and Immunology

## 2023-06-20 ENCOUNTER — Ambulatory Visit (INDEPENDENT_AMBULATORY_CARE_PROVIDER_SITE_OTHER): Payer: Self-pay | Admitting: *Deleted

## 2023-06-20 DIAGNOSIS — J309 Allergic rhinitis, unspecified: Secondary | ICD-10-CM

## 2023-06-29 ENCOUNTER — Other Ambulatory Visit: Payer: Self-pay

## 2023-07-05 ENCOUNTER — Other Ambulatory Visit (HOSPITAL_COMMUNITY): Payer: Self-pay

## 2023-07-09 ENCOUNTER — Other Ambulatory Visit: Payer: Self-pay

## 2023-07-09 NOTE — Progress Notes (Signed)
 Specialty Pharmacy Refill Coordination Note  Bob Roy is a 23 y.o. male contacted today regarding refills of specialty medication(s) Dupilumab  (Dupixent )   Patient requested Delivery   Delivery date: 07/13/23   Verified address: [address] [place] [address]   Medication will be filled on 07/12/23.

## 2023-07-09 NOTE — Progress Notes (Signed)
 Specialty Pharmacy Ongoing Clinical Assessment Note  Bob Roy is a 23 y.o. male who is being followed by the specialty pharmacy service for RxSp Allergy   Patient's specialty medication(s) reviewed today: Dupilumab  (Dupixent )   Missed doses in the last 4 weeks: 0   Patient/Caregiver did not have any additional questions or concerns.   Therapeutic benefit summary: Patient is achieving benefit   Adverse events/side effects summary: No adverse events/side effects   Patient's therapy is appropriate to: Continue    Goals Addressed             This Visit's Progress    Reduce signs and symptoms       Patient is on track. Patient will maintain adherence         Follow up:  6 months  Nykayla Marcelli M Constancia [facility] Specialty Pharmacist

## 2023-07-12 ENCOUNTER — Other Ambulatory Visit: Payer: Self-pay

## 2023-07-24 ENCOUNTER — Other Ambulatory Visit (HOSPITAL_COMMUNITY): Payer: Self-pay

## 2023-07-25 ENCOUNTER — Ambulatory Visit (INDEPENDENT_AMBULATORY_CARE_PROVIDER_SITE_OTHER): Payer: Self-pay | Admitting: *Deleted

## 2023-07-25 DIAGNOSIS — J309 Allergic rhinitis, unspecified: Secondary | ICD-10-CM | POA: Diagnosis not present

## 2023-08-01 ENCOUNTER — Emergency Department (HOSPITAL_COMMUNITY)
Admission: EM | Admit: 2023-08-01 | Discharge: 2023-08-01 | Discharge status: Against Medical Advice | Payer: Medicare Other

## 2023-08-01 NOTE — ED Notes (Signed)
Patient called x3 by 3 separate staff members, no answer. Patient not visible in lobby at this time.

## 2023-08-07 ENCOUNTER — Other Ambulatory Visit (HOSPITAL_COMMUNITY): Payer: Self-pay

## 2023-08-09 ENCOUNTER — Other Ambulatory Visit: Payer: Self-pay

## 2023-08-13 ENCOUNTER — Other Ambulatory Visit: Payer: Self-pay

## 2023-08-27 IMAGING — MR MR ANKLE*R* W/O CM
4 of 5 series · 13 of 40 positions shown · non-contrast
Comparison: None.

CLINICAL DATA: Chronic right ankle pain. History of prior ankle
surgery August 2020.

EXAM:
MRI OF THE RIGHT ANKLE WITHOUT CONTRAST
TECHNIQUE: Multiplanar, multisequence MR imaging of the ankle was performed. No
intravenous contrast was administered.

[Series 4: PD fat-sat · axial · right · 3.5mm · 0.28mm/px · z∈[-108,+18]mm · 4 of 32 slices shown]
[im 1/32]
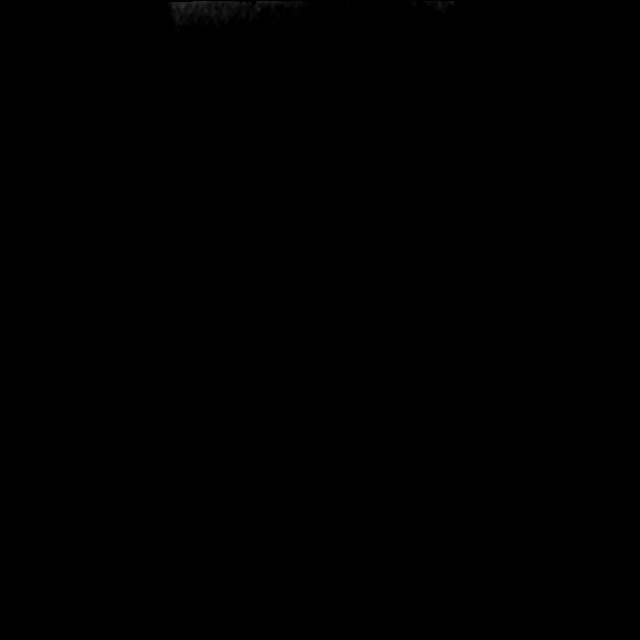
[im 4/32]
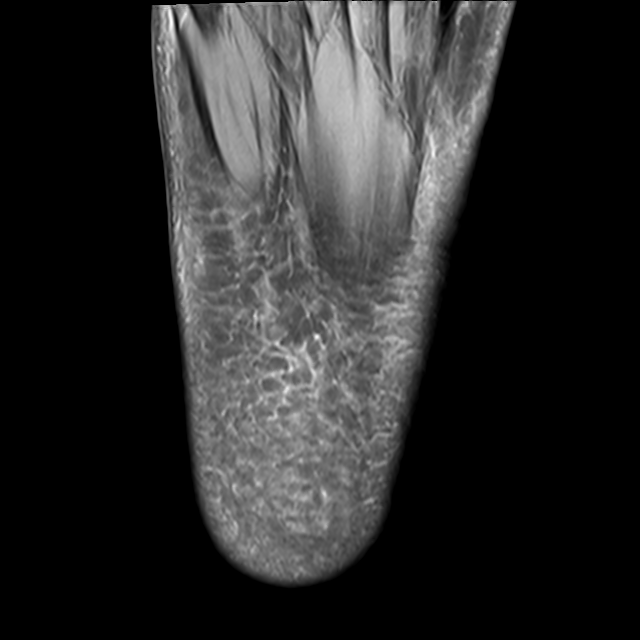
[im 16/32]
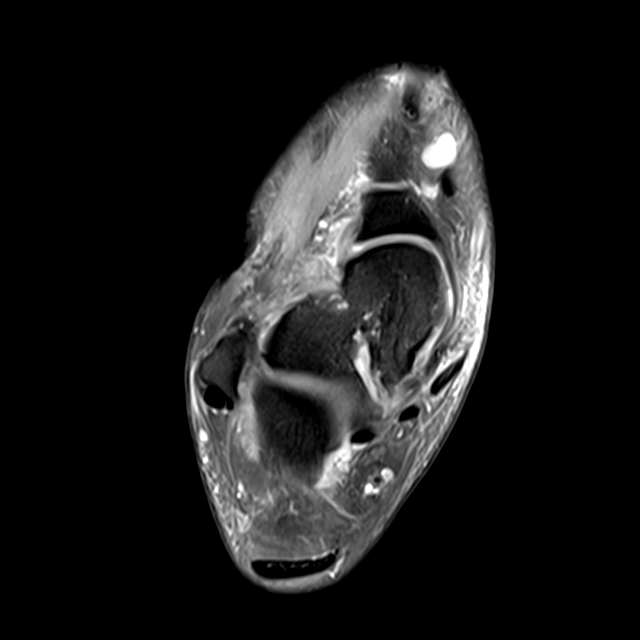
[im 28/32]
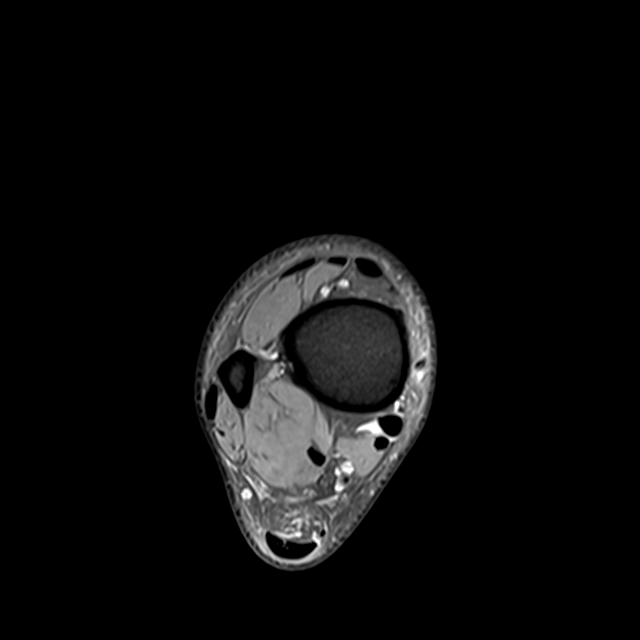

[Series 5: T2 fat-sat · axial · right · 3.5mm · 0.28mm/px · z∈[-94,+18]mm · 3 of 32 slices shown (1 of 2)]
[im 4/32]
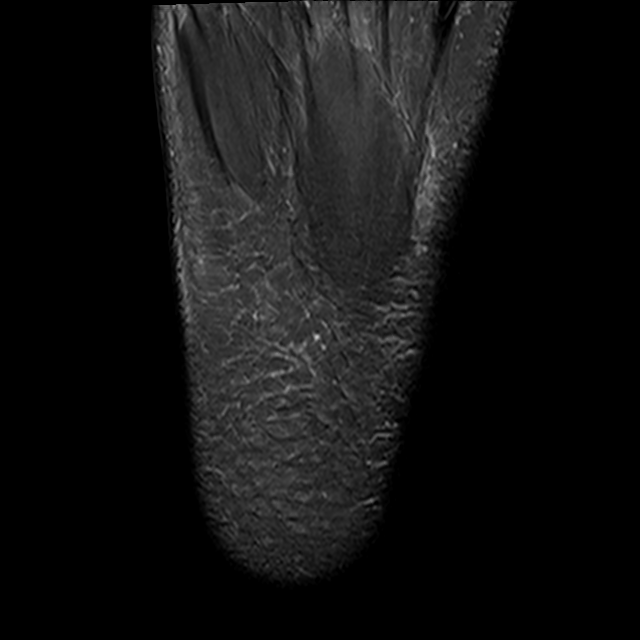
[im 16/32]
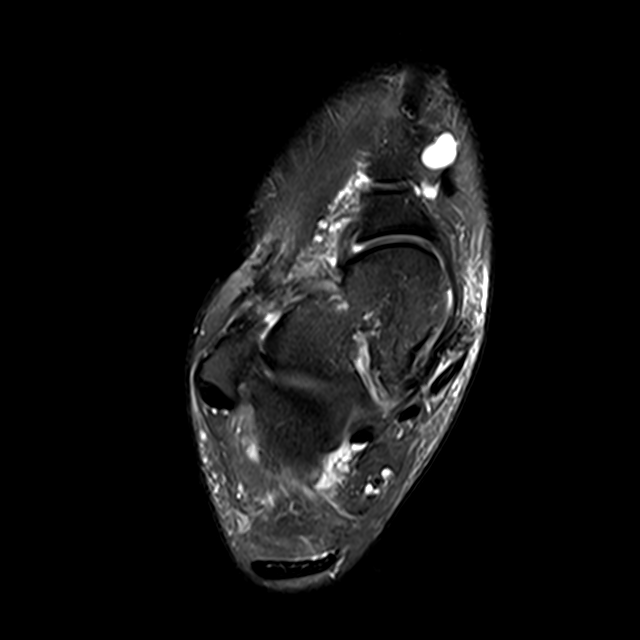
[im 28/32]
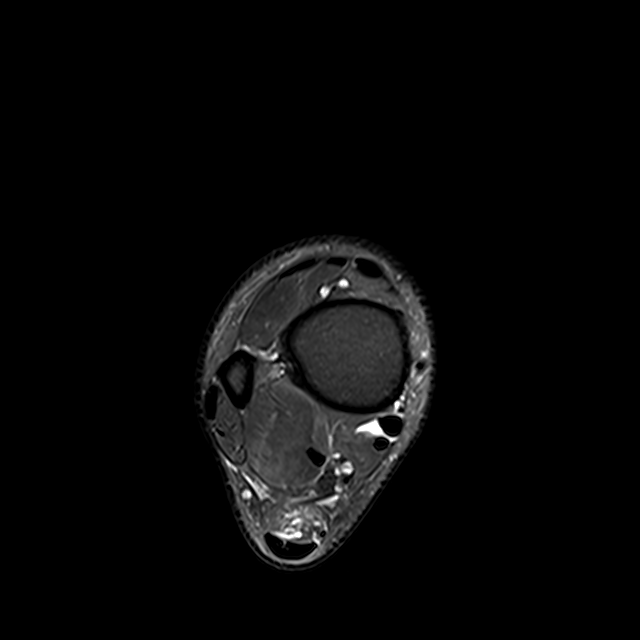

[Series 6: T1 · sagittal · right · 4.0mm · 0.28mm/px · 3 of 24 slices shown]
[im 5/24]
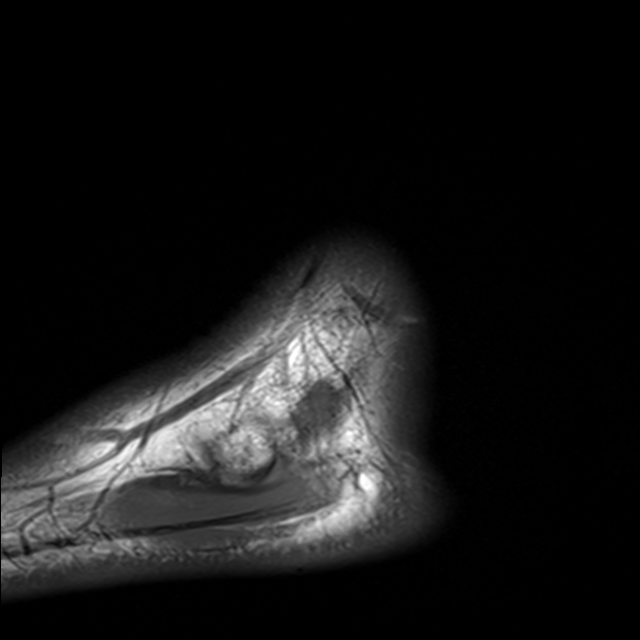
[im 14/24]
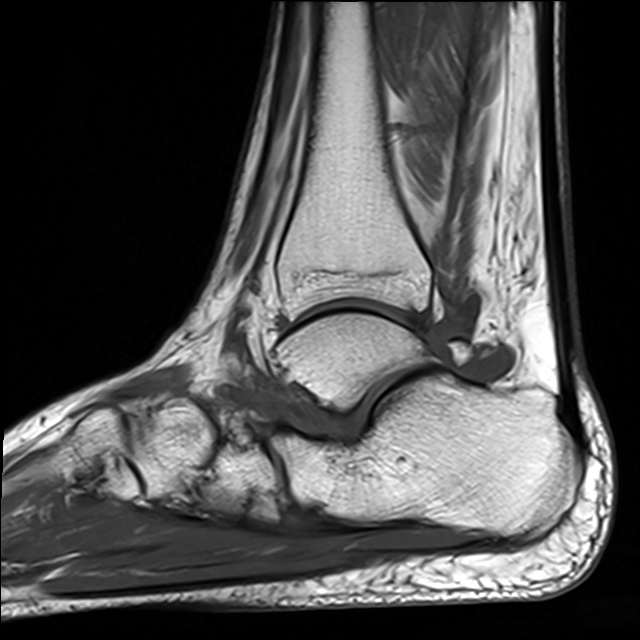
[im 24/24]
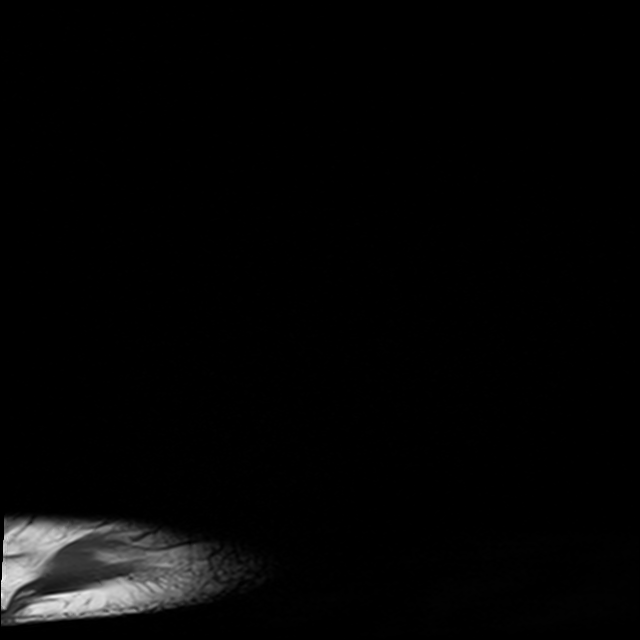

[Series 8: T2 fat-sat · coronal · right · 3.5mm · 0.25mm/px · 3 of 37 slices shown (2 of 2)]
[im 5/37]
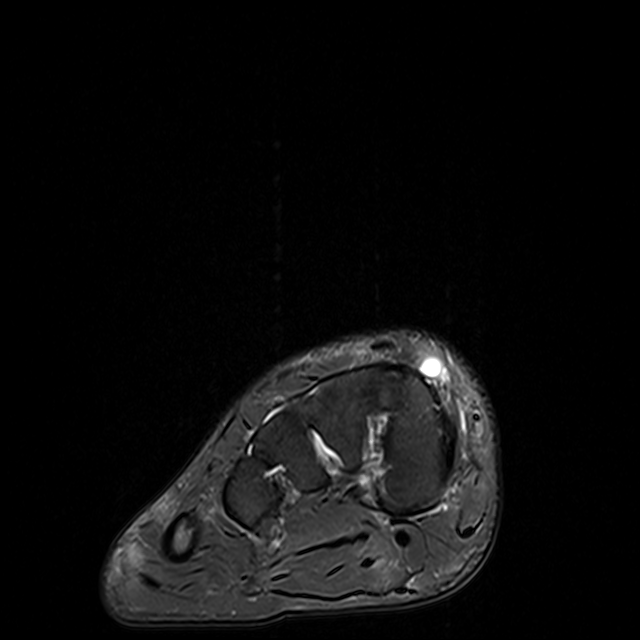
[im 21/37]
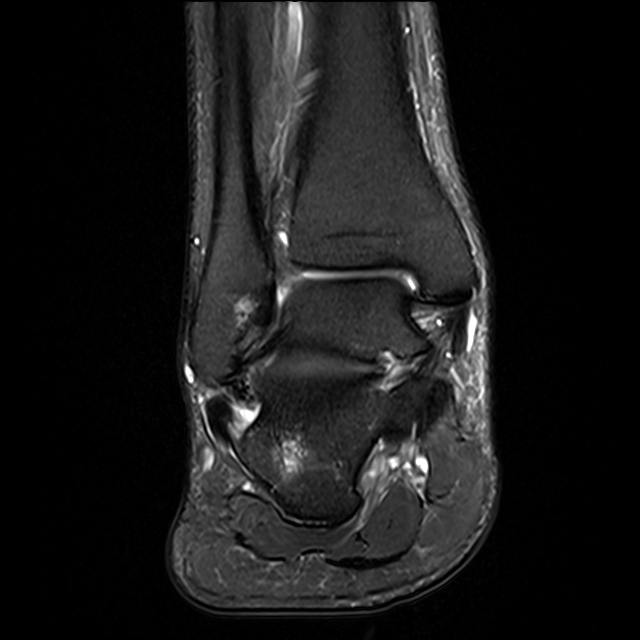
[im 33/37]
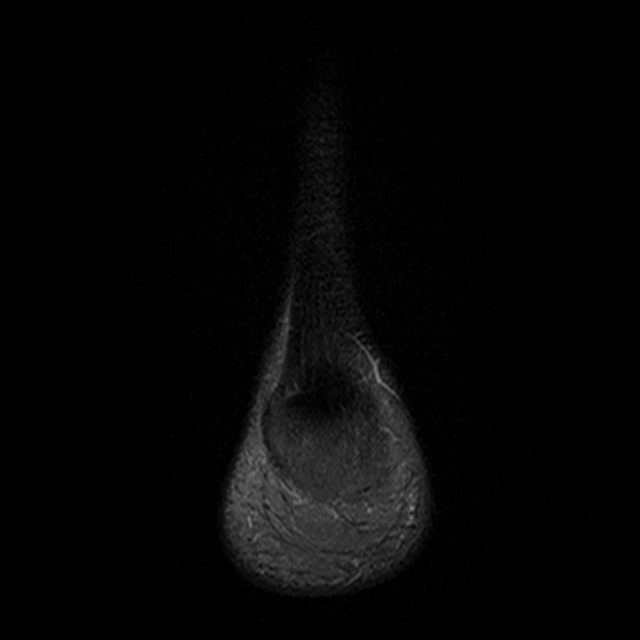

[13 of 40 positions shown; findings below may reference images not displayed]

FINDINGS: TENDONS

Peroneal: Peroneal longus tendon intact. Peroneal brevis intact.

Posteromedial: Mild tendinosis of the posterior tibial tendon with
mild tenosynovitis. Flexor hallucis longus tendon intact. Flexor
digitorum longus tendon intact.

Anterior: Tibialis anterior tendon intact. Extensor hallucis longus
tendon intact Extensor digitorum longus tendon intact.

Achilles:  Intact.

Plantar Fascia: Intact.

LIGAMENTS

Lateral: Complete chronic tear of the anterior talofibular ligament.
Calcaneofibular ligament intact. Posterior talofibular ligament
intact. Anterior and posterior tibiofibular ligaments intact.

Medial: Deltoid ligament intact. Spring ligament intact.

CARTILAGE

Ankle Joint: No joint effusion. Normal ankle mortise. No chondral
defect.

Subtalar Joints/Sinus Tarsi: Normal subtalar joints. Moderate
subtalar joint effusion. Normal sinus tarsi.

Bones: No acute fracture or dislocation. Pes planus. T2 marrow
signal along the superior aspect of the mid calcaneus adjacent to
the sinus tarsi likely reflecting prominent vessels.

Soft Tissue: No fluid collection or hematoma. Muscles are normal
without edema or atrophy. Tarsal tunnel is normal. 12 mm cystic mass
along the dorsal aspect of the medial cuneiform likely reflecting a
ganglion cyst.
IMPRESSION: 1. Complete chronic tear of the anterior talofibular ligament.
2. Mild tendinosis of the posterior tibial tendon with mild
tenosynovitis.
3. Pes planus.
4. Moderate subtalar joint effusion.

## 2023-08-29 ENCOUNTER — Ambulatory Visit (INDEPENDENT_AMBULATORY_CARE_PROVIDER_SITE_OTHER): Payer: Medicare Other

## 2023-08-29 DIAGNOSIS — J309 Allergic rhinitis, unspecified: Secondary | ICD-10-CM | POA: Diagnosis not present

## 2023-09-04 ENCOUNTER — Other Ambulatory Visit: Payer: Self-pay

## 2023-09-04 ENCOUNTER — Other Ambulatory Visit (HOSPITAL_COMMUNITY): Payer: Self-pay

## 2023-09-04 NOTE — Progress Notes (Signed)
 Specialty Pharmacy Refill Coordination Note  Bob Roy is a 23 y.o. male contacted today regarding refills of specialty medication(s) No data recorded  Patient requested (Patient-Rptd) Delivery   Delivery date: (Patient-Rptd) 09/07/23   Verified address: (Patient-Rptd) [address] [place] [id number]   Medication will be filled on 09/06/23.

## 2023-09-06 ENCOUNTER — Other Ambulatory Visit: Payer: Self-pay

## 2023-09-27 ENCOUNTER — Ambulatory Visit (INDEPENDENT_AMBULATORY_CARE_PROVIDER_SITE_OTHER): Payer: Self-pay | Admitting: *Deleted

## 2023-09-27 DIAGNOSIS — J309 Allergic rhinitis, unspecified: Secondary | ICD-10-CM | POA: Diagnosis not present

## 2023-10-02 ENCOUNTER — Other Ambulatory Visit: Payer: Self-pay

## 2023-10-02 ENCOUNTER — Other Ambulatory Visit: Payer: Self-pay | Admitting: Pharmacy Technician

## 2023-10-02 NOTE — Progress Notes (Signed)
 Specialty Pharmacy Refill Coordination Note  Bob Roy is a 23 y.o. male contacted today regarding refills of specialty medication(s) Dupilumab (Dupixent)   Patient requested (Patient-Rptd) Delivery   Delivery date: (Patient-Rptd) 10/03/23   Verified address: (Patient-Rptd) [address], [place], [address]   Medication will be filled on 10/02/23.

## 2023-10-23 ENCOUNTER — Other Ambulatory Visit: Payer: Self-pay

## 2023-10-24 ENCOUNTER — Ambulatory Visit: Payer: Medicare Other | Admitting: Allergy and Immunology

## 2023-10-26 ENCOUNTER — Other Ambulatory Visit: Payer: Self-pay

## 2023-10-29 ENCOUNTER — Ambulatory Visit (INDEPENDENT_AMBULATORY_CARE_PROVIDER_SITE_OTHER): Payer: Self-pay | Admitting: *Deleted

## 2023-10-29 ENCOUNTER — Other Ambulatory Visit: Payer: Self-pay | Admitting: Allergy and Immunology

## 2023-10-29 ENCOUNTER — Other Ambulatory Visit (HOSPITAL_COMMUNITY): Payer: Self-pay

## 2023-10-29 DIAGNOSIS — J309 Allergic rhinitis, unspecified: Secondary | ICD-10-CM

## 2023-10-31 ENCOUNTER — Other Ambulatory Visit: Payer: Self-pay

## 2023-10-31 NOTE — Progress Notes (Signed)
 Specialty Pharmacy Refill Coordination Note  Bob Roy is a 23 y.o. male contacted today regarding refills of specialty medication(s) Dupilumab  (Dupixent )   Patient requested (Patient-Rptd) Delivery   Delivery date: (Patient-Rptd) 11/01/23   Verified address: (Patient-Rptd) [address] [place] [address]   Medication will be filled on 04.30.25.

## 2023-11-01 ENCOUNTER — Ambulatory Visit: Admitting: Allergy and Immunology

## 2023-11-01 ENCOUNTER — Other Ambulatory Visit: Payer: Self-pay

## 2023-11-01 MED ORDER — CETIRIZINE HCL 10 MG PO TABS: ORAL_TABLET | ORAL | 5 refills | Status: AC

## 2023-11-14 ENCOUNTER — Ambulatory Visit: Admitting: Allergy and Immunology

## 2023-11-20 ENCOUNTER — Other Ambulatory Visit (HOSPITAL_COMMUNITY): Payer: Self-pay

## 2023-11-20 DIAGNOSIS — J3089 Other allergic rhinitis: Secondary | ICD-10-CM | POA: Diagnosis not present

## 2023-11-20 NOTE — Progress Notes (Signed)
 VIALS MADE 11-20-23

## 2023-11-21 ENCOUNTER — Other Ambulatory Visit: Payer: Self-pay

## 2023-11-21 DIAGNOSIS — J3081 Allergic rhinitis due to animal (cat) (dog) hair and dander: Secondary | ICD-10-CM | POA: Diagnosis not present

## 2023-11-27 ENCOUNTER — Other Ambulatory Visit: Payer: Self-pay | Admitting: Medical Genetics

## 2023-11-27 ENCOUNTER — Ambulatory Visit (INDEPENDENT_AMBULATORY_CARE_PROVIDER_SITE_OTHER): Payer: Self-pay

## 2023-11-27 ENCOUNTER — Other Ambulatory Visit: Payer: Self-pay

## 2023-11-27 DIAGNOSIS — J309 Allergic rhinitis, unspecified: Secondary | ICD-10-CM | POA: Diagnosis not present

## 2023-11-27 NOTE — Progress Notes (Signed)
 Specialty Pharmacy Refill Coordination Note  Bob Roy is a 23 y.o. male contacted today regarding refills of specialty medication(s) Dupilumab  (Dupixent )   Patient requested (Patient-Rptd) Delivery   Delivery date: (Patient-Rptd) 11/28/23   Verified address: (Patient-Rptd) [address] [place] [address]   Medication will be filled on 11/27/23.

## 2023-12-07 ENCOUNTER — Other Ambulatory Visit: Payer: Self-pay | Admitting: *Deleted

## 2023-12-07 MED ORDER — SYMBICORT 160-4.5 MCG/ACT IN AERO: INHALATION_SPRAY | RESPIRATORY_TRACT | 0 refills | Status: AC

## 2023-12-10 ENCOUNTER — Ambulatory Visit (INDEPENDENT_AMBULATORY_CARE_PROVIDER_SITE_OTHER): Payer: Self-pay | Admitting: *Deleted

## 2023-12-10 DIAGNOSIS — J309 Allergic rhinitis, unspecified: Secondary | ICD-10-CM

## 2023-12-20 ENCOUNTER — Other Ambulatory Visit: Payer: Self-pay

## 2023-12-20 ENCOUNTER — Other Ambulatory Visit: Payer: Self-pay | Admitting: Pharmacy Technician

## 2023-12-20 NOTE — Progress Notes (Addendum)
 Specialty Pharmacy Refill Coordination Note  Bob Roy is a 23 y.o. male contacted today regarding refills of specialty medication(s) Dupilumab  (Dupixent )   Patient requested Delivery   Delivery date: 12/25/23   Verified address: [address] [facility] [address] [place]   Medication will be filled on 12/24/23.

## 2023-12-24 ENCOUNTER — Other Ambulatory Visit: Payer: Self-pay

## 2023-12-25 ENCOUNTER — Other Ambulatory Visit: Payer: Self-pay

## 2024-01-14 ENCOUNTER — Other Ambulatory Visit (HOSPITAL_COMMUNITY): Payer: Self-pay

## 2024-01-14 ENCOUNTER — Other Ambulatory Visit: Payer: Self-pay | Admitting: Family Medicine

## 2024-01-14 ENCOUNTER — Other Ambulatory Visit: Payer: Self-pay

## 2024-01-14 MED ORDER — DUPIXENT 300 MG/2ML ~~LOC~~ SOSY
300.0000 mg | PREFILLED_SYRINGE | SUBCUTANEOUS | 11 refills | Status: AC
  Filled 2024-01-14 – 2024-01-24 (×4): qty 8, 28d supply, fill #0
  Filled 2024-02-14: qty 8, 28d supply, fill #1
  Filled 2024-03-13 – 2024-04-01 (×2): qty 8, 28d supply, fill #2
  Filled 2024-04-24 – 2024-05-09 (×2): qty 8, 28d supply, fill #3
  Filled 2024-06-02 – 2024-06-06 (×2): qty 8, 28d supply, fill #4
  Filled 2024-06-30 – 2024-07-25 (×2): qty 8, 28d supply, fill #5

## 2024-01-15 ENCOUNTER — Other Ambulatory Visit: Payer: Self-pay

## 2024-01-16 ENCOUNTER — Other Ambulatory Visit: Payer: Self-pay

## 2024-01-18 ENCOUNTER — Other Ambulatory Visit: Payer: Self-pay

## 2024-01-22 ENCOUNTER — Other Ambulatory Visit: Payer: Self-pay

## 2024-01-23 ENCOUNTER — Other Ambulatory Visit (HOSPITAL_COMMUNITY)

## 2024-01-24 ENCOUNTER — Other Ambulatory Visit: Payer: Self-pay | Admitting: Pharmacy Technician

## 2024-01-24 ENCOUNTER — Other Ambulatory Visit: Payer: Self-pay

## 2024-01-24 NOTE — Progress Notes (Signed)
 Specialty Pharmacy Refill Coordination Note  Bob Roy is a 23 y.o. male contacted today regarding refills of specialty medication(s) Dupilumab  (Dupixent )   Patient requested Delivery   Delivery date: 01/25/24   Verified address: [address] [name], [place] [address]   Medication will be filled on 01/24/24.

## 2024-02-04 ENCOUNTER — Ambulatory Visit (INDEPENDENT_AMBULATORY_CARE_PROVIDER_SITE_OTHER): Payer: Self-pay | Admitting: *Deleted

## 2024-02-04 DIAGNOSIS — J309 Allergic rhinitis, unspecified: Secondary | ICD-10-CM

## 2024-02-12 ENCOUNTER — Ambulatory Visit (INDEPENDENT_AMBULATORY_CARE_PROVIDER_SITE_OTHER): Payer: Self-pay | Admitting: *Deleted

## 2024-02-12 DIAGNOSIS — J309 Allergic rhinitis, unspecified: Secondary | ICD-10-CM | POA: Diagnosis not present

## 2024-02-14 ENCOUNTER — Other Ambulatory Visit: Payer: Self-pay

## 2024-02-18 ENCOUNTER — Other Ambulatory Visit: Payer: Self-pay

## 2024-02-18 ENCOUNTER — Other Ambulatory Visit: Payer: Self-pay | Admitting: Pharmacy Technician

## 2024-02-18 NOTE — Progress Notes (Signed)
 Specialty Pharmacy Refill Coordination Note  Bob Roy is a 23 y.o. male contacted today regarding refills of specialty medication(s) Dupilumab  (Dupixent )   Patient requested Delivery   Delivery date: 02/19/24   Verified address: [address] [facility] [address] [place]   Medication will be filled on 02/18/24.

## 2024-03-13 ENCOUNTER — Other Ambulatory Visit: Payer: Self-pay

## 2024-03-17 ENCOUNTER — Other Ambulatory Visit: Payer: Self-pay

## 2024-04-01 ENCOUNTER — Other Ambulatory Visit: Payer: Self-pay

## 2024-04-01 NOTE — Progress Notes (Signed)
 Patient Satisfaction Survey complete

## 2024-04-01 NOTE — Progress Notes (Signed)
 Specialty Pharmacy Refill Coordination Note  Bob Roy is a 23 y.o. male contacted today regarding refills of specialty medication(s) Dupilumab  (Dupixent )   Patient requested Delivery   Delivery date: 04/02/24   Verified address: [address] [facility] [address] [place]   Medication will be filled on 04/01/24.

## 2024-04-04 ENCOUNTER — Other Ambulatory Visit: Payer: Self-pay

## 2024-04-04 NOTE — Progress Notes (Signed)
 Specialty Pharmacy Ongoing Clinical Assessment Note  Bob Roy is a 23 y.o. male who is being followed by the specialty pharmacy service for RxSp Allergy   Patient's specialty medication(s) reviewed today: Dupilumab  (Dupixent )   Missed doses in the last 4 weeks: 0   Patient/Caregiver did not have any additional questions or concerns.   Therapeutic benefit summary: Patient is achieving benefit   Adverse events/side effects summary: No adverse events/side effects   Patient's therapy is appropriate to: Continue    Goals Addressed             This Visit's Progress    Reduce signs and symptoms   On track    Patient is on track. Patient will maintain adherence         Follow up: 12 months  Bob Roy Specialty Pharmacist

## 2024-04-11 ENCOUNTER — Other Ambulatory Visit: Payer: Self-pay | Admitting: Medical Genetics

## 2024-04-11 DIAGNOSIS — Z006 Encounter for examination for normal comparison and control in clinical research program: Secondary | ICD-10-CM

## 2024-04-17 ENCOUNTER — Other Ambulatory Visit: Payer: Self-pay | Admitting: Allergy and Immunology

## 2024-04-24 ENCOUNTER — Other Ambulatory Visit (HOSPITAL_COMMUNITY): Payer: Self-pay

## 2024-04-28 ENCOUNTER — Other Ambulatory Visit (HOSPITAL_COMMUNITY): Payer: Self-pay

## 2024-05-02 LAB — GENECONNECT MOLECULAR SCREEN: Genetic Analysis Overall Interpretation: NEGATIVE

## 2024-05-09 ENCOUNTER — Other Ambulatory Visit: Payer: Self-pay

## 2024-05-09 ENCOUNTER — Other Ambulatory Visit (HOSPITAL_COMMUNITY): Payer: Self-pay

## 2024-05-09 NOTE — Progress Notes (Signed)
 Specialty Pharmacy Refill Coordination Note  Bob Roy is a 23 y.o. male contacted today regarding refills of specialty medication(s) Dupilumab  (Dupixent )   Patient requested Delivery   Delivery date: 05/13/24   Verified address: [address] [facility] [address] [place]   Medication will be filled on: 05/12/24

## 2024-05-12 ENCOUNTER — Other Ambulatory Visit: Payer: Self-pay

## 2024-06-02 ENCOUNTER — Other Ambulatory Visit: Payer: Self-pay

## 2024-06-04 ENCOUNTER — Other Ambulatory Visit: Payer: Self-pay

## 2024-06-06 ENCOUNTER — Other Ambulatory Visit: Payer: Self-pay

## 2024-06-06 ENCOUNTER — Other Ambulatory Visit: Payer: Self-pay | Admitting: Pharmacy Technician

## 2024-06-06 NOTE — Progress Notes (Signed)
 Specialty Pharmacy Refill Coordination Note  Manjinder L Harte is a 23 y.o. male contacted today regarding refills of specialty medication(s) Dupilumab  (Dupixent )   Patient requested Delivery   Delivery date: 06/10/24   Verified address: [address] [facility] [address]   Medication will be filled on: 06/09/24

## 2024-06-09 ENCOUNTER — Other Ambulatory Visit: Payer: Self-pay

## 2024-06-16 ENCOUNTER — Encounter: Payer: Self-pay | Admitting: Allergy and Immunology

## 2024-06-16 ENCOUNTER — Ambulatory Visit: Admitting: Allergy and Immunology

## 2024-06-16 VITALS — BP 112/72 | HR 76 | Resp 16 | Ht 75.0 in | Wt 219.0 lb

## 2024-06-16 DIAGNOSIS — J4540 Moderate persistent asthma, uncomplicated: Secondary | ICD-10-CM | POA: Diagnosis not present

## 2024-06-16 DIAGNOSIS — K219 Gastro-esophageal reflux disease without esophagitis: Secondary | ICD-10-CM

## 2024-06-16 DIAGNOSIS — K200 Eosinophilic esophagitis: Secondary | ICD-10-CM

## 2024-06-16 DIAGNOSIS — J3089 Other allergic rhinitis: Secondary | ICD-10-CM

## 2024-06-16 NOTE — Progress Notes (Unsigned)
 [place] [facility] [place] [facility] [place] [facility] [place] [facility] [place]   Follow-up Note  Referring Provider: Keren Vicenta BRAVO, MD Primary Provider: Keren Vicenta BRAVO, MD Date of Office Visit: 06/16/2024  Subjective:   Bob Roy (DOB: [birth year]) is a 23 y.o. male who [facility] in re-evaluation of the following:  HPI: Bob Roy returns to this clinic in evaluation of asthma, allergic rhinitis, EOE. I last saw him in this clinic 23 April 2023.  His EOE is going quite well at this point in time while consistently using his dupilumab  every week and continuing on Protonix  twice a day and famotidine  in the evening.  He has really had no swallowing problems.  He has lost approximately 60 pounds of weight since have last seen him in this clinic.  He has a target weight somewhere between 180 and 200.  He is using a high-protein diet.  His airway has been doing quite well and rarely does use a rescue medicine which currently includes the use of Symbicort  as his anti-inflammatory rescue medication.  He did need to use this agent when he contracted COVID in August.  Apparently he had a fair amount of significant secondary side effects including sinusitis and otitis and mastoiditis and a abdominal pain episode with nausea and vomiting.  But fortunately all of that has resolved.  He is having a difficult time arranging his immunotherapy.  His schedule is quite busy with school and work and family.  Allergies as of 06/16/2024       Reactions   Clavulanic Acid    Gluten Meal    Misc. Sulfonamide Containing Compounds    Tilactase    Amoxicillin Rash   Augmentin [amoxicillin-pot Clavulanate] Rash   Elemental Sulfur Rash   Sulfa Antibiotics Rash        Medication List    albuterol  108 (90 Base) MCG/ACT inhaler Commonly known as: ProAir  HFA Can inhale two puffs every four to six hours as needed for cough or wheeze.   albuterol  108 (90 Base)  MCG/ACT inhaler Commonly known as: VENTOLIN  HFA INHALE 2 PUFFS BY MOUTH EVERY 6 HOURS AS NEEDED FOR WHEEZING AND FOR SHORTNESS OF BREATH   amLODipine  2.5 MG tablet Commonly known as: NORVASC  Take 1 tablet (2.5 mg total) by mouth daily.   celecoxib  200 MG capsule Commonly known as: CELEBREX  Take 200 mg by mouth daily.   cetirizine  10 MG tablet Commonly known as: ZYRTEC  TAKE 1 TABLET BY MOUTH ONCE DAILY AS NEEDED FOR ALLERGIES   cyclobenzaprine 5 MG tablet Commonly known as: FLEXERIL Take 5 mg by mouth 3 (three) times daily as needed for muscle spasms.   Dupixent  300 MG/2ML prefilled syringe Generic drug: dupilumab  Inject 300 mg into the skin every 7 (seven) days.   EPINEPHrine  0.3 mg/0.3 mL Soaj injection Commonly known as: EpiPen  2-Pak Use as directed for life-threatening allergic reaction.   famotidine  20 MG tablet Commonly known as: PEPCID  Take 1 tablet (20 mg total) by mouth daily.   fluticasone  50 MCG/ACT nasal spray Commonly known as: FLONASE  USE 1 TO 2 SPRAY(S) IN EACH NOSTRIL ONCE DAILY   gabapentin 300 MG capsule Commonly known as: NEURONTIN Take 1 capsule by mouth 3 (three) times daily.   mirtazapine 45 MG disintegrating tablet Commonly known as: REMERON SOL-TAB Take 45 mg by mouth at bedtime.   OLANZapine 5 MG tablet Commonly known as: ZYPREXA Take 5 mg by mouth daily.   pantoprazole  40 MG tablet  Commonly known as: PROTONIX  Take 1 tablet (40 mg total) by mouth 2 (two) times daily.   Spacer/[employer] Use with inhaler as directed.   Symbicort  160-4.5 MCG/ACT inhaler Generic drug: budesonide -formoterol  Inhale two puffs with spacer twice daily to prevent cough or wheeze.  Rinse, gargle, and spit after use.   venlafaxine XR 150 MG 24 hr capsule Commonly known as: EFFEXOR-XR Take 150 mg by mouth daily with breakfast.    Past Medical History:  Diagnosis Date   Anxiety    Arthritis    Asthma    Depression    Enlarged prostate     Eosinophilic esophagitis    GERD (gastroesophageal reflux disease)    HTN (hypertension)    Obesity    Seasonal allergies     Past Surgical History:  Procedure Laterality Date   ANKLE SURGERY Right    bilateral wrist surgery Bilateral    CIRCUMCISION     KNEE ARTHROSCOPY Left    NASAL SEPTUM SURGERY Bilateral 02/23/2021   Radial fracture surgery Right    Right hip surgery     TONSILLECTOMY     VENTRAL HERNIA REPAIR     WRIST ARTHROSCOPY Right     Review of systems negative except as noted in HPI / PMHx or noted below:  Review of Systems  Constitutional: Negative.   HENT: Negative.    Eyes: Negative.   Respiratory: Negative.    Cardiovascular: Negative.   Gastrointestinal: Negative.   Genitourinary: Negative.   Musculoskeletal: Negative.   Skin: Negative.   Neurological: Negative.   Endo/Heme/Allergies: Negative.   Psychiatric/Behavioral: Negative.       Objective:   Vitals:   06/16/24 1634  BP: 112/72  Pulse: 76  Resp: 16  SpO2: 98%   Height: 6' 3 (190.5 cm)  Weight: 219 lb (99.3 kg)   Physical Exam Constitutional:      Appearance: He is not diaphoretic.  HENT:     Head: Normocephalic.     Right Ear: Tympanic membrane, ear canal and external ear normal.     Left Ear: Tympanic membrane, ear canal and external ear normal.     Nose: Nose normal. No mucosal edema or rhinorrhea.     Mouth/Throat:     Pharynx: Uvula midline. No oropharyngeal exudate.  Eyes:     Conjunctiva/sclera: Conjunctivae normal.  Neck:     Thyroid: No thyromegaly.     Trachea: Trachea normal. No tracheal tenderness or tracheal deviation.  Cardiovascular:     Rate and Rhythm: Normal rate and regular rhythm.     Heart sounds: Normal heart sounds, S1 normal and S2 normal. No murmur heard. Pulmonary:     Effort: No respiratory distress.     Breath sounds: Normal breath sounds. No stridor. No wheezing or rales.  Lymphadenopathy:     Head:     Right side of head: No tonsillar  adenopathy.     Left side of head: No tonsillar adenopathy.     Cervical: No cervical adenopathy.  Skin:    Findings: No erythema or rash.     Nails: There is no clubbing.  Neurological:     Mental Status: He is alert.     Diagnostics: Spirometry was performed and demonstrated an FEV1 of *** at *** % of predicted.  Assessment and Plan:   1. Eosinophilic esophagitis   2. Asthma, moderate persistent, well-controlled   3. Perennial allergic rhinitis   4. Gastroesophageal reflux disease, unspecified whether esophagitis present  1.  Allergen avoidance measures - pollen, cat, dog, dust mite  2.  Continue to treat and prevent inflammation:  A. Dupilumab  injections every week B. Flonase  1-2 sprays in each nostril 1-2 times a day C. Continue immunotherapy???  3. Continue Protonix  40mg  -  2 times per day + famotidine  40 mg in evening  4. If needed:   A. Symbicort  160-2 puffs 1-2 times a day with a spacer (replaces albuterol )  B. Cetirizine  10 mg once a day for a runny nose  C. EpiPen   D. Nasal saline rinses  5. Return to clinic in 6 months or earlier if problem  6. Influenza = Tamiflu. Covid = Paxlovid  Brain appears to be doing very well using his dupilumab  and proton pump inhibitor and H2 receptor blocker to treat his EOE.  The only other anti-inflammatory he uses for his respiratory tract at this point is Flonase .  He has rare requirement for using Symbicort .  I do not really know if he is going to receive any benefit from intermittently and rarely using any immunotherapy and he should consider discontinuing that form of treatment.  Assuming he does well with the plan noted above we will see him back in this clinic in 6 months or earlier if there is a problem.  Camellia Denis, MD Allergy / Immunology [facility]

## 2024-06-16 NOTE — Patient Instructions (Addendum)
°  1.  Allergen avoidance measures - pollen, cat, dog, dust mite  2.  Continue to treat and prevent inflammation:  A. Dupilumab  injections every week B. Flonase  1-2 sprays in each nostril 1-2 times a day C. Continue immunotherapy???  3. Continue Protonix  40mg  -  2 times per day + famotidine  40 mg in evening  4. If needed:   A. Symbicort  160-2 puffs 1-2 times a day with a spacer (replaces albuterol )  B. Cetirizine  10 mg once a day for a runny nose  C. EpiPen   D. Nasal saline rinses  5. Return to clinic in 6 months or earlier if problem  6. Influenza = Tamiflu. Covid = Paxlovid

## 2024-06-17 ENCOUNTER — Encounter: Payer: Self-pay | Admitting: Allergy and Immunology

## 2024-06-30 ENCOUNTER — Other Ambulatory Visit: Payer: Self-pay

## 2024-07-02 ENCOUNTER — Other Ambulatory Visit (HOSPITAL_COMMUNITY): Payer: Self-pay

## 2024-07-04 ENCOUNTER — Other Ambulatory Visit (HOSPITAL_COMMUNITY): Payer: Self-pay

## 2024-07-06 ENCOUNTER — Other Ambulatory Visit: Payer: Self-pay | Admitting: Allergy and Immunology

## 2024-07-25 ENCOUNTER — Other Ambulatory Visit: Payer: Self-pay

## 2024-07-25 NOTE — Progress Notes (Signed)
 Specialty Pharmacy Refill Coordination Note  Bob Roy is a 24 y.o. male contacted today regarding refills of specialty medication(s) Dupilumab  (Dupixent )   Patient requested Delivery   Delivery date: 07/30/24   Verified address: [address] [facility] [address]   Medication will be filled on: 07/29/24  Patient aware medication may be delayed depending on weather

## 2024-07-29 ENCOUNTER — Other Ambulatory Visit: Payer: Self-pay
